# Patient Record
Sex: Male | Born: 1945 | Race: Black or African American | Hispanic: No | Smoking: Current some day smoker
Health system: Southern US, Community
[De-identification: ages and names within clinical notes are randomized; demographics above are authoritative.]

## PROBLEM LIST (undated history)

## (undated) DIAGNOSIS — F4310 Post-traumatic stress disorder, unspecified: Secondary | ICD-10-CM

## (undated) DIAGNOSIS — G8929 Other chronic pain: Secondary | ICD-10-CM

## (undated) DIAGNOSIS — Z923 Personal history of irradiation: Secondary | ICD-10-CM

## (undated) DIAGNOSIS — C3490 Malignant neoplasm of unspecified part of unspecified bronchus or lung: Secondary | ICD-10-CM

## (undated) DIAGNOSIS — C801 Malignant (primary) neoplasm, unspecified: Secondary | ICD-10-CM

## (undated) DIAGNOSIS — M549 Dorsalgia, unspecified: Secondary | ICD-10-CM

## (undated) DIAGNOSIS — I10 Essential (primary) hypertension: Secondary | ICD-10-CM

## (undated) HISTORY — DX: Personal history of irradiation: Z92.3

---

## 2010-01-06 ENCOUNTER — Emergency Department (HOSPITAL_COMMUNITY)
Admission: EM | Admit: 2010-01-06 | Discharge: 2010-01-06 | Payer: Self-pay | Source: Home / Self Care | Admitting: Emergency Medicine

## 2012-02-01 ENCOUNTER — Emergency Department (HOSPITAL_COMMUNITY)
Admission: EM | Admit: 2012-02-01 | Discharge: 2012-02-01 | Disposition: A | Discharge status: Home / Self Care | Payer: Non-veteran care | Attending: Emergency Medicine | Admitting: Emergency Medicine

## 2012-02-01 ENCOUNTER — Encounter (HOSPITAL_COMMUNITY): Payer: Self-pay | Admitting: *Deleted

## 2012-02-01 DIAGNOSIS — F17200 Nicotine dependence, unspecified, uncomplicated: Secondary | ICD-10-CM | POA: Insufficient documentation

## 2012-02-01 DIAGNOSIS — M5126 Other intervertebral disc displacement, lumbar region: Secondary | ICD-10-CM | POA: Insufficient documentation

## 2012-02-01 DIAGNOSIS — Z85118 Personal history of other malignant neoplasm of bronchus and lung: Secondary | ICD-10-CM | POA: Insufficient documentation

## 2012-02-01 DIAGNOSIS — F4310 Post-traumatic stress disorder, unspecified: Secondary | ICD-10-CM | POA: Insufficient documentation

## 2012-02-01 DIAGNOSIS — M545 Low back pain: Secondary | ICD-10-CM

## 2012-02-01 DIAGNOSIS — I10 Essential (primary) hypertension: Secondary | ICD-10-CM | POA: Insufficient documentation

## 2012-02-01 HISTORY — DX: Post-traumatic stress disorder, unspecified: F43.10

## 2012-02-01 HISTORY — DX: Essential (primary) hypertension: I10

## 2012-02-01 HISTORY — DX: Malignant (primary) neoplasm, unspecified: C80.1

## 2012-02-01 HISTORY — DX: Malignant neoplasm of unspecified part of unspecified bronchus or lung: C34.90

## 2012-02-01 MED ORDER — PREDNISONE 50 MG PO TABS: 50.0000 mg | ORAL_TABLET | Freq: Every day | ORAL | Status: DC

## 2012-02-01 MED ORDER — OXYCODONE-ACETAMINOPHEN 5-325 MG PO TABS: 1.0000 | ORAL_TABLET | Freq: Four times a day (QID) | ORAL | Status: DC | PRN

## 2012-02-01 MED ORDER — CYCLOBENZAPRINE HCL 10 MG PO TABS: 10.0000 mg | ORAL_TABLET | Freq: Two times a day (BID) | ORAL | Status: DC | PRN

## 2012-02-01 MED ORDER — KETOROLAC TROMETHAMINE 60 MG/2ML IM SOLN
60.0000 mg | Freq: Once | INTRAMUSCULAR | Status: AC
  Administered 2012-02-01: 60 mg via INTRAMUSCULAR
  Filled 2012-02-01: qty 2

## 2012-02-01 NOTE — ED Provider Notes (Signed)
History  This chart was scribed for Donnetta Hutching, MD by Ardeen Jourdain. This patient was seen in room APA11/APA11 and the patient's care was started at 1313.  CSN: [id number]  Arrival date & time 02/01/12  1206   First MD Initiated Contact with Patient 02/01/12 1313      Chief Complaint  Patient presents with  . Back Pain     The history is provided by the patient. No language interpreter was used.    Billy Collier is a 66 y.o. male who presents to the Emergency Department complaining of back pain with associated pain radiating through his legs and numbness in his right thigh. He says the pain started in his back 2-3 weeks ago. 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 He went to the [place] [facility] in [place] two weeks ago for the pain, and they gave him a back brace and pain medications, which he states doesn't work. He has a h/o CA, HTN, PSTD. He is a current everyday smoker but denies alcohol use.    Past Medical History  Diagnosis Date  . Cancer   . Lung cancer   . Hypertension   . PTSD (post-traumatic stress disorder)     History reviewed. No pertinent past surgical history.  History reviewed. No pertinent family history.  History  Substance Use Topics  . Smoking status: Current Some Day Smoker  . Smokeless tobacco: Not on file  . Alcohol Use: Yes      Review of Systems  A complete 10 system review of systems was obtained and all systems are negative except as noted in the HPI and PMH.    Allergies  Review of patient's allergies indicates no known allergies.  Home Medications   Current Outpatient Rx  Name Route Sig Dispense Refill  . LISINOPRIL 10 MG PO TABS Oral Take 25 mg by mouth daily.      Triage Vitals:BP 111/82  Pulse 90  Temp 97.6 F (36.4 C) (Oral)  Resp 18  Ht 5\' 11"  (1.803 m)  Wt 146 lb (66.225 kg)  BMI 20.36 kg/m2  SpO2 100%  Physical Exam  Nursing note and vitals reviewed. Constitutional: He is oriented to person, place, and time.  He appears well-developed and well-nourished.  HENT:  Head: Normocephalic and atraumatic.  Eyes: Conjunctivae normal and EOM are normal. Pupils are equal, round, and reactive to light.  Neck: Normal range of motion. Neck supple.  Cardiovascular: Normal rate, regular rhythm and normal heart sounds.   Pulmonary/Chest: Effort normal and breath sounds normal.  Abdominal: Soft. Bowel sounds are normal.  Musculoskeletal: Normal range of motion.       L4-L5 tenderness, normal SLR bilaterally  Neurological: He is alert and oriented to person, place, and time.  Skin: Skin is warm and dry.  Psychiatric: He has a normal mood and affect.    ED Course  Procedures (including critical care time)  DIAGNOSTIC STUDIES: Oxygen Saturation is 100% on room air, normal air by my interpretation.    COORDINATION OF CARE:  1420- Discussed treatment plan with pt at bedside and pt agreed to plan. Percocet, prednisone and muscle relaxants will be prescribed.    Labs Reviewed - No data to display No results found.   No diagnosis found.    MDM  No bowel or bladder incontinence. Patient has pain for 30 years. Rx for Percocet, Flexeril, prednisone. [facility]   I personally performed the services described in  this documentation, which was scribed in my presence. The recorded information has been reviewed and considered.       Donnetta Hutching, MD 02/01/12 1538

## 2012-02-01 NOTE — ED Notes (Addendum)
Low back pain, being tx at [facility] Recently had PET scan to evaluate "spots on my lungs" and adrenal gland.Since Pet scan back has hurt. And unable to get to appt in [place].  Pt finished chemo and radiation for lung cancer last year

## 2012-11-28 ENCOUNTER — Emergency Department (HOSPITAL_COMMUNITY)
Admission: EM | Admit: 2012-11-28 | Discharge: 2012-11-28 | Disposition: A | Discharge status: Home / Self Care | Payer: Non-veteran care | Attending: Emergency Medicine | Admitting: Emergency Medicine

## 2012-11-28 ENCOUNTER — Encounter (HOSPITAL_COMMUNITY): Payer: Self-pay

## 2012-11-28 DIAGNOSIS — M545 Low back pain, unspecified: Secondary | ICD-10-CM | POA: Insufficient documentation

## 2012-11-28 DIAGNOSIS — Z8659 Personal history of other mental and behavioral disorders: Secondary | ICD-10-CM | POA: Insufficient documentation

## 2012-11-28 DIAGNOSIS — F17200 Nicotine dependence, unspecified, uncomplicated: Secondary | ICD-10-CM | POA: Insufficient documentation

## 2012-11-28 DIAGNOSIS — Z85118 Personal history of other malignant neoplasm of bronchus and lung: Secondary | ICD-10-CM | POA: Insufficient documentation

## 2012-11-28 DIAGNOSIS — IMO0002 Reserved for concepts with insufficient information to code with codable children: Secondary | ICD-10-CM | POA: Insufficient documentation

## 2012-11-28 DIAGNOSIS — G8929 Other chronic pain: Secondary | ICD-10-CM | POA: Insufficient documentation

## 2012-11-28 DIAGNOSIS — Z79899 Other long term (current) drug therapy: Secondary | ICD-10-CM | POA: Insufficient documentation

## 2012-11-28 DIAGNOSIS — I10 Essential (primary) hypertension: Secondary | ICD-10-CM | POA: Insufficient documentation

## 2012-11-28 HISTORY — DX: Dorsalgia, unspecified: M54.9

## 2012-11-28 HISTORY — DX: Other chronic pain: G89.29

## 2012-11-28 MED ORDER — DEXAMETHASONE 4 MG PO TABS: 4.0000 mg | ORAL_TABLET | Freq: Two times a day (BID) | ORAL | Status: DC

## 2012-11-28 MED ORDER — IBUPROFEN 600 MG PO TABS: 600.0000 mg | ORAL_TABLET | Freq: Four times a day (QID) | ORAL | Status: DC | PRN

## 2012-11-28 MED ORDER — OXYCODONE-ACETAMINOPHEN 5-325 MG PO TABS: 1.0000 | ORAL_TABLET | ORAL | Status: DC | PRN

## 2012-11-28 MED ORDER — DIAZEPAM 5 MG PO TABS: 5.0000 mg | ORAL_TABLET | Freq: Three times a day (TID) | ORAL | Status: DC | PRN

## 2012-11-28 NOTE — ED Notes (Signed)
Pt reports chronic back pain, back went out 6 weeks ago and since that time, pain radiates to legs and is having trouble w/ movement. Denies any urinary/bowel incont.

## 2012-12-01 NOTE — ED Provider Notes (Signed)
CSN: [id number]     Arrival date & time 11/28/12  1519 History     First MD Initiated Contact with Patient 11/28/12 1606     Chief Complaint  Patient presents with  . Back Pain   67 year old male with lower back pain. Does not lateralize. Vision has a history of chronic back pain going back many years. States that his current pain feels similar to previous exacerbations. Patient recently his pain began getting worse about 6 weeks ago. Denies any specific trauma. Pain radiates into bilateral legs. Worse with movement. No fevers or chills. No acute numbness, tingling or loss strength. No urinary complaints. No incontinence.  (Consider location/radiation/quality/duration/timing/severity/associated sxs/prior Treatment) HPI  Past Medical History  Diagnosis Date  . Cancer   . Lung cancer   . Hypertension   . PTSD (post-traumatic stress disorder)   . Chronic back pain    History reviewed. No pertinent past surgical history. No family history on file. History  Substance Use Topics  . Smoking status: Current Some Day Smoker    Types: Cigarettes  . Smokeless tobacco: Not on file  . Alcohol Use: Yes     Comment: occ    Review of Systems  All systems reviewed and negative, other than as noted in HPI.   Allergies  Review of patient's allergies indicates no known allergies.  Home Medications   Current Outpatient Rx  Name  Route  Sig  Dispense  Refill  . ENSURE (ENSURE)   Oral   Take 237 mLs by mouth continuous dialysis.         Marland Kitchen lisinopril (PRINIVIL,ZESTRIL) 10 MG tablet   Oral   Take 25 mg by mouth daily.         . magnesium oxide (MAG-OX) 400 MG tablet   Oral   Take 400 mg by mouth 2 (two) times daily.         Marland Kitchen oxyCODONE-acetaminophen (PERCOCET/ROXICET) 5-325 MG per tablet   Oral   Take 1-2 tablets by mouth every 6 (six) hours as needed for pain.   30 tablet   0   . predniSONE (DELTASONE) 50 MG tablet   Oral   Take 1 tablet (50 mg total) by mouth daily.   7 tablet   1   . dexamethasone (DECADRON) 4 MG tablet   Oral   Take 1 tablet (4 mg total) by mouth 2 (two) times daily with a meal.   10 tablet   0   . diazepam (VALIUM) 5 MG tablet   Oral   Take 1 tablet (5 mg total) by mouth every 8 (eight) hours as needed (muscle spasm).   15 tablet   0   . ibuprofen (ADVIL,MOTRIN) 600 MG tablet   Oral   Take 1 tablet (600 mg total) by mouth every 6 (six) hours as needed for pain.   30 tablet   0   . oxyCODONE-acetaminophen (PERCOCET/ROXICET) 5-325 MG per tablet   Oral   Take 1-2 tablets by mouth every 4 (four) hours as needed for pain.   20 tablet   0    BP 121/89  Pulse 104  Temp(Src) 97.4 F (36.3 C) (Oral)  Resp 16  Ht 5\' 10"  (1.778 m)  Wt 140 lb (63.504 kg)  BMI 20.09 kg/m2  SpO2 100% Physical Exam  Nursing note and vitals reviewed. Constitutional: He is oriented to person, place, and time. He appears well-developed and well-nourished. No distress.  HENT:  Head: Normocephalic and atraumatic.  Eyes: Conjunctivae  are normal. Right eye exhibits no discharge. Left eye exhibits no discharge.  Neck: Neck supple.  Cardiovascular: Normal rate, regular rhythm and normal heart sounds.  Exam reveals no gallop and no friction rub.   No murmur heard. Pulmonary/Chest: Effort normal and breath sounds normal. No respiratory distress.  Abdominal: Soft. He exhibits no distension. There is no tenderness.  Musculoskeletal: He exhibits no edema and no tenderness.  Gait steady. Sensation intact to light touch bilateral lower extremities. Strength is 5 out of 5 hilar lower extremities. Good DP pulses. No midline Spinal tenderness.  Neurological: He is alert and oriented to person, place, and time.  Skin: Skin is warm and dry.  Psychiatric: He has a normal mood and affect. His behavior is normal. Thought content normal.    ED Course   Procedures (including critical care time)  Labs Reviewed - No data to display No results found. 1.  Lower back pain     MDM  67 year old male with lower back pain. Chronic in nature. No concerning "red flags" aside from past history of cancer. Nonfocal neurological examination. Patient reports a very long-standing history of similar pain. Doubt bony metastases. Plan symptomatic treatment. Emergent return precautions were discussed. Outpatient followup otherwise.  Raeford Razor, MD 12/01/12 [phone]

## 2012-12-08 ENCOUNTER — Emergency Department (HOSPITAL_COMMUNITY)
Admission: EM | Admit: 2012-12-08 | Discharge: 2012-12-08 | Disposition: A | Discharge status: Home / Self Care | Payer: Non-veteran care | Attending: Emergency Medicine | Admitting: Emergency Medicine

## 2012-12-08 ENCOUNTER — Emergency Department (HOSPITAL_COMMUNITY): Payer: Non-veteran care

## 2012-12-08 ENCOUNTER — Encounter (HOSPITAL_COMMUNITY): Payer: Self-pay | Admitting: *Deleted

## 2012-12-08 DIAGNOSIS — F17200 Nicotine dependence, unspecified, uncomplicated: Secondary | ICD-10-CM | POA: Insufficient documentation

## 2012-12-08 DIAGNOSIS — Z85118 Personal history of other malignant neoplasm of bronchus and lung: Secondary | ICD-10-CM | POA: Insufficient documentation

## 2012-12-08 DIAGNOSIS — X58XXXA Exposure to other specified factors, initial encounter: Secondary | ICD-10-CM | POA: Insufficient documentation

## 2012-12-08 DIAGNOSIS — I10 Essential (primary) hypertension: Secondary | ICD-10-CM | POA: Insufficient documentation

## 2012-12-08 DIAGNOSIS — S32009A Unspecified fracture of unspecified lumbar vertebra, initial encounter for closed fracture: Secondary | ICD-10-CM

## 2012-12-08 DIAGNOSIS — Y929 Unspecified place or not applicable: Secondary | ICD-10-CM | POA: Insufficient documentation

## 2012-12-08 DIAGNOSIS — Z8659 Personal history of other mental and behavioral disorders: Secondary | ICD-10-CM | POA: Insufficient documentation

## 2012-12-08 DIAGNOSIS — Y939 Activity, unspecified: Secondary | ICD-10-CM | POA: Insufficient documentation

## 2012-12-08 DIAGNOSIS — Z79899 Other long term (current) drug therapy: Secondary | ICD-10-CM | POA: Insufficient documentation

## 2012-12-08 MED ORDER — DIAZEPAM 5 MG PO TABS: 5.0000 mg | ORAL_TABLET | Freq: Three times a day (TID) | ORAL | Status: AC | PRN

## 2012-12-08 MED ORDER — IBUPROFEN 600 MG PO TABS: 600.0000 mg | ORAL_TABLET | Freq: Four times a day (QID) | ORAL | Status: AC | PRN

## 2012-12-08 MED ORDER — DEXAMETHASONE 4 MG PO TABS: 4.0000 mg | ORAL_TABLET | Freq: Two times a day (BID) | ORAL | Status: DC

## 2012-12-08 MED ORDER — OXYCODONE-ACETAMINOPHEN 5-325 MG PO TABS: 1.0000 | ORAL_TABLET | ORAL | Status: DC | PRN

## 2012-12-08 NOTE — ED Notes (Signed)
Chronic back pain, seen here 7/28 for same. Says she cannot make it to [facility] to the [place]

## 2012-12-08 NOTE — ED Notes (Signed)
Pt scheduled for an MRI on August 12 at 11, to be here at 10:45. Information written on pt's D/C papers. Pt verbalized understanding.

## 2012-12-09 ENCOUNTER — Telehealth: Payer: Self-pay | Admitting: Internal Medicine

## 2012-12-09 NOTE — Telephone Encounter (Signed)
REFERRAL FORWARDED TO [facility] THROUGH STAFF MESSAGE.

## 2012-12-11 NOTE — ED Provider Notes (Signed)
CSN: [id number]     Arrival date & time 12/08/12  1131 History     First MD Initiated Contact with Patient 12/08/12 1200     Chief Complaint  Patient presents with  . Back Pain   (Consider location/radiation/quality/duration/timing/severity/associated sxs/prior Treatment) HPI Comments: Billy Collier is a 67 y.o. Male presenting with chronic low back pain which is progressively worsening.  He is a [place] patient being seen in [place] and has been treated for lung cancer, blamed on Agent Orange exposure while in [place], with chemotherapy which was completed 5/14.  He describes having had studies since, mentions xrays and a ?PET scan and his doctor told him "he has new spots" but patient cannot elaborate on the extent or location of these spots.  However, he was told he cannot get further treatment for his cancer at this time.  He continues to have persistent pain and has difficulty obtaining transportation to [facility].  He expresses frustration and also is desirous of a second opinion about his cancer.   Patient denies any new injury specifically.  There is no radiation of pain into the lower extremities.  There has been no weakness or numbness in the lower extremities and no urinary or bowel retention or incontinence.  He was seen here 10 days ago and given medicines that were very helpful with his pain,but has run out.  Pain is worse with movement,  Better at rest.   The history is provided by the patient.    Past Medical History  Diagnosis Date  . Hypertension   . PTSD (post-traumatic stress disorder)   . Chronic back pain   . Cancer   . Lung cancer    History reviewed. No pertinent past surgical history. History reviewed. No pertinent family history. History  Substance Use Topics  . Smoking status: Current Some Day Smoker    Types: Cigarettes  . Smokeless tobacco: Not on file  . Alcohol Use: No    Review of Systems  Constitutional: Negative for fever.  Respiratory: Negative for  shortness of breath.   Cardiovascular: Negative for chest pain and leg swelling.  Gastrointestinal: Negative for abdominal pain, constipation and abdominal distention.  Genitourinary: Negative for dysuria, urgency, frequency, flank pain and difficulty urinating.  Musculoskeletal: Positive for back pain. Negative for joint swelling and gait problem.  Skin: Negative for rash.  Neurological: Negative for weakness and numbness.    Allergies  Review of patient's allergies indicates no known allergies.  Home Medications   Current Outpatient Rx  Name  Route  Sig  Dispense  Refill  . ENSURE (ENSURE)   Oral   Take 237 mLs by mouth continuous dialysis.         Marland Kitchen lisinopril (PRINIVIL,ZESTRIL) 10 MG tablet   Oral   Take 25 mg by mouth daily.         . magnesium oxide (MAG-OX) 400 MG tablet   Oral   Take 400 mg by mouth 2 (two) times daily.         Marland Kitchen oxyCODONE-acetaminophen (PERCOCET/ROXICET) 5-325 MG per tablet   Oral   Take 1-2 tablets by mouth every 4 (four) hours as needed for pain.   20 tablet   0   . Simethicone (GAS-X PO)   Oral   Take 1 tablet by mouth daily as needed (gas).         Marland Kitchen dexamethasone (DECADRON) 4 MG tablet   Oral   Take 1 tablet (4 mg total) by mouth 2 (  two) times daily with a meal.   10 tablet   0   . diazepam (VALIUM) 5 MG tablet   Oral   Take 1 tablet (5 mg total) by mouth every 8 (eight) hours as needed (muscle spasm).   15 tablet   0   . ibuprofen (ADVIL,MOTRIN) 600 MG tablet   Oral   Take 1 tablet (600 mg total) by mouth every 6 (six) hours as needed for pain.   30 tablet   0   . oxyCODONE-acetaminophen (PERCOCET/ROXICET) 5-325 MG per tablet   Oral   Take 1 tablet by mouth every 4 (four) hours as needed for pain.   30 tablet   0    BP 139/83  Pulse 100  Temp(Src) 97.5 F (36.4 C) (Oral)  Resp 19  Ht 5\' 11"  (1.803 m)  Wt 143 lb (64.864 kg)  BMI 19.95 kg/m2  SpO2 100% Physical Exam  Nursing note and vitals  reviewed. Constitutional: He appears well-developed and well-nourished.  HENT:  Head: Normocephalic.  Eyes: Conjunctivae are normal.  Neck: Normal range of motion. Neck supple.  Cardiovascular: Normal rate and intact distal pulses.   Pedal pulses normal.  Pulmonary/Chest: Effort normal.  Abdominal: Soft. Bowel sounds are normal. He exhibits no distension and no mass.  Musculoskeletal: Normal range of motion. He exhibits no edema.       Lumbar back: He exhibits tenderness and bony tenderness. He exhibits no swelling, no edema, no deformity and no spasm.  Neurological: He is alert. He has normal strength. He displays no atrophy and no tremor. No sensory deficit. Gait normal.  Reflex Scores:      Patellar reflexes are 2+ on the right side and 2+ on the left side.      Achilles reflexes are 2+ on the right side and 2+ on the left side. No strength deficit noted in hip and knee flexor and extensor muscle groups.  Ankle flexion and extension intact.  Skin: Skin is warm and dry.  Psychiatric: He has a normal mood and affect.    ED Course   Procedures (including critical care time)  Labs Reviewed - No data to display No results found. 1. Lumbar vertebral fracture, closed, initial encounter     MDM  Results of xray discussed with patient, including suspicion that his fractures are related to his cancer.  mri recommend and will be scheduled as outpatient.  Discussed case with Dr Judd Lien who agrees with plan.  Also, will give referral for local oncology f/u with Dr. Arbutus Ped. Pt to call for appt.  In interim,  He was prescribed repeat of meds given at last visit including decadron, ibuprofen, diazepam and oxycodone.  No neuro deficit on exam or by history to suggest emergent or surgical presentation.  Also discussed worsened sx that should prompt immediate re-evaluation including distal weakness, bowel/bladder retention/incontinence.        Burgess Amor, PA-C 12/11/12 1206

## 2012-12-12 ENCOUNTER — Telehealth: Payer: Self-pay | Admitting: *Deleted

## 2012-12-12 NOTE — Telephone Encounter (Signed)
Tried to call pt.  Unable to leave VM message will call again later

## 2012-12-12 NOTE — ED Provider Notes (Signed)
Medical screening examination/treatment/procedure(s) were performed by non-physician practitioner and as supervising physician I was immediately available for consultation/collaboration.  Geoffery Lyons, MD 12/12/12 [phone]

## 2012-12-12 NOTE — Telephone Encounter (Signed)
Tried to call pt for appt.  Unable to leave vm message.  Will call back

## 2012-12-13 ENCOUNTER — Ambulatory Visit (HOSPITAL_COMMUNITY)
Admission: RE | Admit: 2012-12-13 | Discharge: 2012-12-13 | Disposition: A | Discharge status: Home / Self Care | Payer: Medicare Other | Source: Ambulatory Visit | Attending: Emergency Medicine | Admitting: Emergency Medicine

## 2012-12-13 ENCOUNTER — Telehealth: Payer: Self-pay | Admitting: *Deleted

## 2012-12-13 ENCOUNTER — Encounter (HOSPITAL_COMMUNITY): Payer: Self-pay

## 2012-12-13 DIAGNOSIS — M545 Low back pain, unspecified: Secondary | ICD-10-CM | POA: Insufficient documentation

## 2012-12-13 DIAGNOSIS — M51379 Other intervertebral disc degeneration, lumbosacral region without mention of lumbar back pain or lower extremity pain: Secondary | ICD-10-CM | POA: Insufficient documentation

## 2012-12-13 DIAGNOSIS — M5137 Other intervertebral disc degeneration, lumbosacral region: Secondary | ICD-10-CM | POA: Insufficient documentation

## 2012-12-13 DIAGNOSIS — R937 Abnormal findings on diagnostic imaging of other parts of musculoskeletal system: Secondary | ICD-10-CM | POA: Insufficient documentation

## 2012-12-13 DIAGNOSIS — M5126 Other intervertebral disc displacement, lumbar region: Secondary | ICD-10-CM | POA: Insufficient documentation

## 2012-12-13 NOTE — Telephone Encounter (Signed)
Spoke with pt regarding appt for 12/14/12.  He verbalized understanding of time and place of appt

## 2012-12-14 ENCOUNTER — Encounter: Payer: Self-pay | Admitting: Internal Medicine

## 2012-12-14 ENCOUNTER — Ambulatory Visit: Payer: Medicare Other

## 2012-12-14 ENCOUNTER — Other Ambulatory Visit: Payer: Medicare Other | Admitting: Lab

## 2012-12-14 ENCOUNTER — Ambulatory Visit (HOSPITAL_BASED_OUTPATIENT_CLINIC_OR_DEPARTMENT_OTHER): Payer: Medicare Other | Admitting: Internal Medicine

## 2012-12-14 ENCOUNTER — Telehealth: Payer: Self-pay | Admitting: Internal Medicine

## 2012-12-14 DIAGNOSIS — I10 Essential (primary) hypertension: Secondary | ICD-10-CM

## 2012-12-14 DIAGNOSIS — C3490 Malignant neoplasm of unspecified part of unspecified bronchus or lung: Secondary | ICD-10-CM | POA: Insufficient documentation

## 2012-12-14 DIAGNOSIS — F4310 Post-traumatic stress disorder, unspecified: Secondary | ICD-10-CM

## 2012-12-14 DIAGNOSIS — M545 Low back pain: Secondary | ICD-10-CM

## 2012-12-14 DIAGNOSIS — R0609 Other forms of dyspnea: Secondary | ICD-10-CM

## 2012-12-14 DIAGNOSIS — F17200 Nicotine dependence, unspecified, uncomplicated: Secondary | ICD-10-CM

## 2012-12-14 NOTE — Progress Notes (Signed)
[facility] Telephone:([phone]) [phone]   [phone]  CONSULT NOTE  REFERRING PHYSICIAN: Dr. Raeford Razor  REASON FOR CONSULTATION:  67 years old African American male with history of lung cancer  HPI Billy Collier is a 67 y.o. male was past medical history significant for hypertension as well as a ruptured disc in the lumbar vertebrae L4-5 as well as long history of smoking. The patient mentions that he was diagnosed with lung cancer in 2011 but no records are available for his diagnosis or previous treatment. His initial evaluation and biopsy was performed at the [facility] facility in [place], [place]. He was then transferred to the [facility] facility in [place], [place]. Again no records are available but the patient mentions that he received treatment with concurrent chemotherapy and radiation. He also received chemotherapy every 3 weeks until May of 2014.  His last scan was performed in May of 2014. He also mentions that the oncologist at the [facility] hospital in [place] told him that he has no option for him. The patient decided to transfer his care to [facility] for further evaluation of his condition. He is feeling fine today with no specific complaints except for the low back pain. He had MRI of the lumbar spine on 12/13/2012 and it showed diffuse marrow infiltration compatible with neoplasm. Metastatic disease, myeloma or lymphoma are primary considerations. There was also a 1 and L2 compression fractures likely pathologic based on the infiltrative marrow process. No tenderness the vertebral body heights was at the L2 than anyone without retropulsion and resulting severe central stenosis. There is no cord edema. There is also T12 and L1 spinous process fractures. The patient denied having any significant weight loss or night sweats. He denied having any chest pain but continues to have shortness breath with exertion and mild cough with no hemoptysis.  Family history  significant for a mother with stroke and hypertension and father with cancer of unknown type. The patient is sedated and has 4 children. His caregiver is his sister, Lucio Edward in [place]. He is currently on disability and used to work as a Science writer. The patient has a history of smoking one pack per day for more than 55 years and unfortunately continues to smoke a few cigarettes every day. Strongly advise him to quit smoking. No history of alcohol or drug abuse.   @SFHPI @  Past Medical History  Diagnosis Date  . Hypertension   . PTSD (post-traumatic stress disorder)   . Chronic back pain   . Cancer   . Lung cancer     History reviewed. No pertinent past surgical history.  History reviewed. No pertinent family history.  Social History History  Substance Use Topics  . Smoking status: Current Some Day Smoker    Types: Cigarettes  . Smokeless tobacco: Not on file  . Alcohol Use: No    No Known Allergies  Current Outpatient Prescriptions  Medication Sig Dispense Refill  . dexamethasone (DECADRON) 4 MG tablet Take 1 tablet (4 mg total) by mouth 2 (two) times daily with a meal.  10 tablet  0  . diazepam (VALIUM) 5 MG tablet Take 1 tablet (5 mg total) by mouth every 8 (eight) hours as needed (muscle spasm).  15 tablet  0  . ENSURE (ENSURE) Take 237 mLs by mouth continuous dialysis.      Marland Kitchen ibuprofen (ADVIL,MOTRIN) 600 MG tablet Take 1 tablet (600 mg total) by mouth every 6 (six) hours as needed  for pain.  30 tablet  0  . lisinopril (PRINIVIL,ZESTRIL) 10 MG tablet Take 25 mg by mouth daily.      . magnesium oxide (MAG-OX) 400 MG tablet Take 400 mg by mouth 2 (two) times daily.      Marland Kitchen oxyCODONE-acetaminophen (PERCOCET/ROXICET) 5-325 MG per tablet Take 1-2 tablets by mouth every 4 (four) hours as needed for pain.  20 tablet  0  . Simethicone (GAS-X PO) Take 1 tablet by mouth daily as needed (gas).       No current facility-administered medications for this visit.     Review of Systems  A comprehensive review of systems was negative except for: Constitutional: positive for fatigue Respiratory: positive for dyspnea on exertion Musculoskeletal: positive for back pain  Physical Exam  ZOX:WRUEA, healthy, no distress, well nourished and well developed SKIN: skin color, texture, turgor are normal HEAD: Normocephalic, No masses, lesions, tenderness or abnormalities EYES: normal, PERRLA EARS: External ears normal OROPHARYNX:no exudate and no erythema  NECK: supple, no adenopathy LYMPH:  no palpable lymphadenopathy, no hepatosplenomegaly LUNGS: clear to auscultation  HEART: regular rate & rhythm, no murmurs and no gallops ABDOMEN:abdomen soft, non-tender, normal bowel sounds and no masses or organomegaly BACK: Back symmetric, no curvature. EXTREMITIES:no joint deformities, effusion, or inflammation, no edema, no skin discoloration  NEURO: alert & oriented x 3 with fluent speech, no focal motor/sensory deficits  PERFORMANCE STATUS: ECOG 1  LABORATORY DATA: No results found for this basename: WBC, HGB, HCT, MCV, PLT      Chemistry   No results found for this basename: NA, K, CL, CO2, BUN, CREATININE, GLU   No results found for this basename: CALCIUM, ALKPHOS, AST, ALT, BILITOT       RADIOGRAPHIC STUDIES: Dg Lumbar Spine Complete  12/08/2012   *RADIOLOGY REPORT*  Clinical Data: Back pain.  Lung cancer.  LUMBAR SPINE - COMPLETE 4+ VIEW  Comparison: None  Findings: Mild compression fracture of L1.  There is patchy sclerosis of the vertebral bodies suggesting this could be a pathologic fracture.  Moderately severe compression fracture of L2.  There is sclerosis of the vertebral body with the possibility of metastatic disease.  No other fracture.  Patchy sclerotic changes also present at L3 and L4.  There is disc degeneration at L4-5 and L5-S1.  IMPRESSION: Fractures of L1 and L2 may be recent fractures.  These are suspicious for pathologic fractures.   Further evaluation with MRI without and with contrast suggested.   Original Report Authenticated By: Janeece Riggers, M.D.   Mr Lumbar Spine Wo Contrast  12/13/2012   *RADIOLOGY REPORT*  Clinical Data: Chronic low back pain.  Bilateral extension to the legs.  Pain and weakness.  MRI LUMBAR SPINE WITHOUT CONTRAST  Technique:  Multiplanar and multiecho pulse sequences of the lumbar spine were obtained without intravenous contrast.  Comparison: Radiographs 12/08/2012.  Findings: The numbering convention used for this exam terms L5-S1 as the last full intervertebral disc space above the sacrum.  The marrow signal is diffusely abnormal, most compatible with infiltrative process such as myeloma or metastatic disease. Osseous lymphoma merits consideration as well.  Compression fractures of L1 and L2 are present.  The L1 compression fracture shows 50% loss of anterior vertebral body height without retropulsion. L2 shows about 70% loss of anterior vertebral body height with mild retropulsion.  There is resulting severe central stenosis at L1-L2.  The spinal cord terminates posterior to L1-L2 interspace with the retropulsed L2 superior vertebra contacting the tip of the  conus medullaris.  Allowing for technical differences, there appears to be little if any change compared to the recent radiographs of 12/08/2012.   The marrow process extends into the posterior elements, again compatible with neoplasm rather than a benign process. There appear to be fractures of the T12 and L1 spinous processes on the inversion recovery images. Cystic lesion is present in the inferior right kidney, probably a renal cyst.  There is mild levoconvex curve of the lumbar spine with the apex at L3.  T10-T11:  Partially visualized.  There is some distortion at the edge of the margin of the scan however there may be the posterior expansion of the T10 vertebra encroaching on the central canal.  A thoracic spine MRI should be considered.  T11-T12:  Disc  desiccation.  No stenosis.  T12-L1:  Disc desiccation.  Mild expansion of the pedicles associated with infiltrative marrow process with mild right foraminal encroachment.  L1-L2:  Severe central stenosis with complete effacement of the subarachnoid space.  This is predominately due to retropulsion with some contribution from posterior element expansion.  L2-L3:  Mild central stenosis associated with left eccentric broad- based disc bulging.  There is moderate left and mild right foraminal stenosis associated with short pedicles and bulging disc.  L3-L4:  Disc desiccation and loss of height.  Left foraminal and lateral protrusion.  Mild symmetric bilateral foraminal stenosis without neural compression. Moderate central stenosis associated with broad-based disc bulging and short pedicles.  L4-L5:  Severe disc degeneration.  There is some clumping of nerve roots this level, however this is probably secondary to the central stenosis at levels above.  There is moderate bilateral foraminal stenosis and mild to moderate central stenosis.  Encroachment on both lateral recesses potentially affecting the descending L5 nerves.  L5-S1:  Severe disc degeneration.  Central canal patent.  Bilateral left greater than right lateral recess encroachment associated with broad-based disc bulging and endplate osteophyte.  Moderate left and mild right foraminal stenosis.  IMPRESSION: 1.  Diffuse marrow infiltration compatible with neoplasm. Metastatic disease, myeloma, or lymphoma are primary considerations. 2.  L1 and L2 compression fractures are likely pathologic based on the infiltrative marrow process.  Greater loss of vertebral body height at L2 than at L1 with retropulsion and resulting severe central stenosis.  No cord edema.  Probable T12 and L1 spinous process fractures. 3.  Multilevel degenerative disease superimposed, detailed above.   Original Report Authenticated By: Andreas Newport, M.D.    ASSESSMENT: This is a very  pleasant 67 years old [place] American male with metastatic lung cancer according to the patient but unfortunately I have no records at of his history or previous treatment.   PLAN: I have a lengthy discussion with the patient today about his condition. I ask the patient to sign a form for the release of medical records from the [facility] in [place], [address] and [place], [place]. His last CT scans were performed in May of 2014. I will arrange for the patient to have repeat CT scan of the chest, abdomen and pelvis for restaging of his disease. I would see the patient back for followup visit in 2 weeks for reevaluation and more detailed discussion of his treatment options based on the available medical records and the staging scans. He'll continue on his current pain medication for the low back pain. He was advised to call immediately if he has any concerning symptoms in the interval.  The patient voices understanding of current disease status and  treatment options and is in agreement with the current care plan.  All questions were answered. The patient knows to call the clinic with any problems, questions or concerns. We can certainly see the patient much sooner if necessary.  Thank you so much for allowing me to participate in the care of [facility]. I will continue to follow up the patient with you and assist in his care.  I spent 35 minutes counseling the patient face to face. The total time spent in the appointment was 55 minutes.  Tametria Aho K. 12/14/2012, 4:45 PM

## 2012-12-14 NOTE — Telephone Encounter (Signed)
gave pt appt for lab and MD for Augusr 2014, gave pt oral contrast for CT

## 2012-12-14 NOTE — Progress Notes (Signed)
Checked in new patient with no financial issues. Didn't ask if living will/poa. Mail and phone only. He said will have a new address at the end of the month.

## 2012-12-20 ENCOUNTER — Other Ambulatory Visit: Payer: Self-pay | Admitting: *Deleted

## 2012-12-20 ENCOUNTER — Ambulatory Visit (HOSPITAL_COMMUNITY)
Admission: RE | Admit: 2012-12-20 | Discharge: 2012-12-20 | Disposition: A | Discharge status: Home / Self Care | Payer: Self-pay | Source: Ambulatory Visit | Attending: Internal Medicine | Admitting: Internal Medicine

## 2012-12-20 ENCOUNTER — Other Ambulatory Visit (HOSPITAL_BASED_OUTPATIENT_CLINIC_OR_DEPARTMENT_OTHER): Payer: Non-veteran care

## 2012-12-20 DIAGNOSIS — C3490 Malignant neoplasm of unspecified part of unspecified bronchus or lung: Secondary | ICD-10-CM

## 2012-12-20 DIAGNOSIS — E079 Disorder of thyroid, unspecified: Secondary | ICD-10-CM | POA: Insufficient documentation

## 2012-12-20 DIAGNOSIS — C7951 Secondary malignant neoplasm of bone: Secondary | ICD-10-CM | POA: Insufficient documentation

## 2012-12-20 DIAGNOSIS — Z9221 Personal history of antineoplastic chemotherapy: Secondary | ICD-10-CM | POA: Insufficient documentation

## 2012-12-20 DIAGNOSIS — R918 Other nonspecific abnormal finding of lung field: Secondary | ICD-10-CM | POA: Insufficient documentation

## 2012-12-20 DIAGNOSIS — M8448XA Pathological fracture, other site, initial encounter for fracture: Secondary | ICD-10-CM | POA: Insufficient documentation

## 2012-12-20 DIAGNOSIS — C787 Secondary malignant neoplasm of liver and intrahepatic bile duct: Secondary | ICD-10-CM | POA: Insufficient documentation

## 2012-12-20 DIAGNOSIS — N400 Enlarged prostate without lower urinary tract symptoms: Secondary | ICD-10-CM | POA: Insufficient documentation

## 2012-12-20 DIAGNOSIS — E279 Disorder of adrenal gland, unspecified: Secondary | ICD-10-CM | POA: Insufficient documentation

## 2012-12-20 LAB — COMPREHENSIVE METABOLIC PANEL (CC13)
ALT: 41 U/L (ref 0–55)
AST: 38 U/L — ABNORMAL HIGH (ref 5–34)
Albumin: 3.5 g/dL (ref 3.5–5.0)
Alkaline Phosphatase: 239 U/L — ABNORMAL HIGH (ref 40–150)
BUN: 15.9 mg/dL (ref 7.0–26.0)
Potassium: 4 mEq/L (ref 3.5–5.1)
Sodium: 135 mEq/L — ABNORMAL LOW (ref 136–145)

## 2012-12-20 LAB — CBC WITH DIFFERENTIAL/PLATELET
BASO%: 0.6 % (ref 0.0–2.0)
Basophils Absolute: 0 10*3/uL (ref 0.0–0.1)
EOS%: 0.1 % (ref 0.0–7.0)
MCH: 31.4 pg (ref 27.2–33.4)
MCHC: 33.8 g/dL (ref 32.0–36.0)
MCV: 93 fL (ref 79.3–98.0)
MONO%: 6.4 % (ref 0.0–14.0)
RBC: 4.24 10*6/uL (ref 4.20–5.82)
RDW: 17.4 % — ABNORMAL HIGH (ref 11.0–14.6)
lymph#: 1 10*3/uL (ref 0.9–3.3)

## 2012-12-20 MED ORDER — OXYCODONE-ACETAMINOPHEN 5-325 MG PO TABS: 1.0000 | ORAL_TABLET | ORAL | Status: DC | PRN

## 2012-12-20 MED ORDER — IOHEXOL 300 MG/ML  SOLN
100.0000 mL | Freq: Once | INTRAMUSCULAR | Status: AC | PRN
  Administered 2012-12-20: 100 mL via INTRAVENOUS

## 2012-12-20 NOTE — Telephone Encounter (Signed)
Patient walked in c/o pain to joints where he walks (hip) and pain to his lower back at a ruptured disc.  Went to ER on 11-28-2012, was given percocet.  Took one last night.  Refill authorized by Dr. Arbutus Ped.

## 2012-12-29 ENCOUNTER — Ambulatory Visit
Admission: RE | Admit: 2012-12-29 | Discharge: 2012-12-29 | Disposition: A | Discharge status: Home / Self Care | Payer: Non-veteran care | Source: Ambulatory Visit | Attending: Radiation Oncology | Admitting: Radiation Oncology

## 2012-12-29 ENCOUNTER — Other Ambulatory Visit: Payer: Self-pay | Admitting: Emergency Medicine

## 2012-12-29 ENCOUNTER — Ambulatory Visit (HOSPITAL_BASED_OUTPATIENT_CLINIC_OR_DEPARTMENT_OTHER): Payer: Non-veteran care | Admitting: Lab

## 2012-12-29 ENCOUNTER — Encounter: Payer: Self-pay | Admitting: Radiation Oncology

## 2012-12-29 ENCOUNTER — Ambulatory Visit (HOSPITAL_COMMUNITY)
Admission: RE | Admit: 2012-12-29 | Discharge: 2012-12-29 | Disposition: A | Discharge status: Home / Self Care | Payer: Self-pay | Source: Ambulatory Visit | Attending: Adult Health | Admitting: Adult Health

## 2012-12-29 ENCOUNTER — Ambulatory Visit (HOSPITAL_BASED_OUTPATIENT_CLINIC_OR_DEPARTMENT_OTHER): Payer: Non-veteran care

## 2012-12-29 ENCOUNTER — Encounter: Payer: Self-pay | Admitting: Adult Health

## 2012-12-29 ENCOUNTER — Ambulatory Visit
Admission: RE | Admit: 2012-12-29 | Discharge: 2012-12-29 | Disposition: A | Discharge status: Home / Self Care | Payer: Medicare Other | Source: Ambulatory Visit | Attending: Radiation Oncology | Admitting: Radiation Oncology

## 2012-12-29 ENCOUNTER — Telehealth: Payer: Self-pay | Admitting: Medical Oncology

## 2012-12-29 ENCOUNTER — Ambulatory Visit (HOSPITAL_BASED_OUTPATIENT_CLINIC_OR_DEPARTMENT_OTHER): Payer: Medicare Other | Admitting: Adult Health

## 2012-12-29 ENCOUNTER — Ambulatory Visit: Payer: Medicare Other | Admitting: Internal Medicine

## 2012-12-29 DIAGNOSIS — C7951 Secondary malignant neoplasm of bone: Secondary | ICD-10-CM

## 2012-12-29 DIAGNOSIS — C3490 Malignant neoplasm of unspecified part of unspecified bronchus or lung: Secondary | ICD-10-CM

## 2012-12-29 DIAGNOSIS — Z510 Encounter for antineoplastic radiation therapy: Secondary | ICD-10-CM | POA: Insufficient documentation

## 2012-12-29 DIAGNOSIS — C787 Secondary malignant neoplasm of liver and intrahepatic bile duct: Secondary | ICD-10-CM | POA: Insufficient documentation

## 2012-12-29 DIAGNOSIS — I10 Essential (primary) hypertension: Secondary | ICD-10-CM | POA: Insufficient documentation

## 2012-12-29 DIAGNOSIS — K5900 Constipation, unspecified: Secondary | ICD-10-CM | POA: Insufficient documentation

## 2012-12-29 DIAGNOSIS — G893 Neoplasm related pain (acute) (chronic): Secondary | ICD-10-CM

## 2012-12-29 DIAGNOSIS — M4804 Spinal stenosis, thoracic region: Secondary | ICD-10-CM | POA: Insufficient documentation

## 2012-12-29 DIAGNOSIS — Z79899 Other long term (current) drug therapy: Secondary | ICD-10-CM | POA: Insufficient documentation

## 2012-12-29 DIAGNOSIS — F17200 Nicotine dependence, unspecified, uncomplicated: Secondary | ICD-10-CM | POA: Insufficient documentation

## 2012-12-29 DIAGNOSIS — M8448XA Pathological fracture, other site, initial encounter for fracture: Secondary | ICD-10-CM | POA: Insufficient documentation

## 2012-12-29 DIAGNOSIS — R630 Anorexia: Secondary | ICD-10-CM | POA: Insufficient documentation

## 2012-12-29 DIAGNOSIS — R110 Nausea: Secondary | ICD-10-CM | POA: Insufficient documentation

## 2012-12-29 DIAGNOSIS — C7949 Secondary malignant neoplasm of other parts of nervous system: Secondary | ICD-10-CM | POA: Insufficient documentation

## 2012-12-29 DIAGNOSIS — R29898 Other symptoms and signs involving the musculoskeletal system: Secondary | ICD-10-CM | POA: Insufficient documentation

## 2012-12-29 LAB — CBC WITH DIFFERENTIAL/PLATELET
Basophils Absolute: 0 10*3/uL (ref 0.0–0.1)
Eosinophils Absolute: 0 10*3/uL (ref 0.0–0.5)
HGB: 11.9 g/dL — ABNORMAL LOW (ref 13.0–17.1)
LYMPH%: 14.3 % (ref 14.0–49.0)
MCH: 30.8 pg (ref 27.2–33.4)
MCV: 85.8 fL (ref 79.3–98.0)
MONO%: 7.7 % (ref 0.0–14.0)
NEUT#: 6.2 10*3/uL (ref 1.5–6.5)
Platelets: 115 10*3/uL — ABNORMAL LOW (ref 140–400)
RBC: 3.86 10*6/uL — ABNORMAL LOW (ref 4.20–5.82)

## 2012-12-29 LAB — COMPREHENSIVE METABOLIC PANEL (CC13)
Alkaline Phosphatase: 210 U/L — ABNORMAL HIGH (ref 40–150)
BUN: 8.5 mg/dL (ref 7.0–26.0)
Glucose: 120 mg/dl (ref 70–140)
Total Bilirubin: 0.59 mg/dL (ref 0.20–1.20)

## 2012-12-29 MED ORDER — GADOBENATE DIMEGLUMINE 529 MG/ML IV SOLN
15.0000 mL | Freq: Once | INTRAVENOUS | Status: AC | PRN
  Administered 2012-12-29: 12 mL via INTRAVENOUS

## 2012-12-29 MED ORDER — DEXAMETHASONE SODIUM PHOSPHATE 20 MG/5ML IJ SOLN
20.0000 mg | Freq: Once | INTRAMUSCULAR | Status: AC
  Administered 2012-12-29: 20 mg via INTRAVENOUS

## 2012-12-29 MED ORDER — CYCLOBENZAPRINE HCL 5 MG PO TABS
5.0000 mg | ORAL_TABLET | Freq: Three times a day (TID) | ORAL | Status: DC | PRN
Start: 2012-12-29 — End: 2013-01-11

## 2012-12-29 MED ORDER — DEXAMETHASONE 4 MG PO TABS: 8.0000 mg | ORAL_TABLET | Freq: Three times a day (TID) | ORAL | Status: DC

## 2012-12-29 MED ORDER — OXYCODONE-ACETAMINOPHEN 5-325 MG PO TABS: 1.0000 | ORAL_TABLET | ORAL | Status: DC | PRN

## 2012-12-29 NOTE — Progress Notes (Addendum)
Histology and Location of Primary Cancer: lung cancer  Sites of Visceral and Bony Metastatic Disease: Location(s) of Symptomatic Metastases: MRI  12/29/12:1. 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 Today's post-contrast  images demonstrate extension of dural tumor into the lower thoracic  spine as far as the T10 level.  2. Widespread thoracic metastatic disease. T10 moderate pathologic  compression fracture. 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   Past/Anticipated chemotherapy by medical oncology, if any: [facility] [place] [place] [facility] until May of 2014.   Pain on a scale of 0-10 is: 5 in lower back and legs  If Spine Met(s), symptoms, if any, include:  Bowel/Bladder retention or incontinence (please describe): no Numbness or weakness in extremities (please describe): right leg is weak, tingling in both feet and toes Current Decadron regimen, if applicable : 4 mg 2x day  With a meal Ambulatory status? Walker? Wheelchair?: ambulatory  SAFETY ISSUES:  [facility]? Yes [facility] [place], [place]  Pacemaker/ICD? no  Possible current pregnancy? no  Is the patient on methotrexate? no  Current Complaints / other details:  Patient had #24 gauge IV in his right forearm.  It was removed intact and pressure and bandaid was applied.

## 2012-12-29 NOTE — Progress Notes (Signed)
12/29/12 3:50pm - lm at the [facility] fee basis department for patient.  Message I left explained that patient was sent down from Medical Oncology for a consultation and simulation to start radiation treatment tomorrow.  I also spoke to the patient and his wife and explained that the visit today may not be covered by [place].  He said he would pay out of pocket if necessary.  Explained I would do what I could to get an authorization, but if he has not been seen by the [place] in a while, they may not authorize the visits or the treatment.  Patient also stated that he would be getting Part B benefits in October.

## 2012-12-29 NOTE — Telephone Encounter (Signed)
Unable to reach pt to cancel appt but contacted a relative who stated pt is on way to [place] .

## 2012-12-29 NOTE — Telephone Encounter (Signed)
I called radiaiton back about clarification of referral. Dr Arbutus Ped is already seeing pt.

## 2012-12-29 NOTE — Progress Notes (Signed)
  Radiation Oncology         [phone] ________________________________  Name: Billy Collier MRN: [id number]  Date: 12/29/2012  DOB: 04/25/1946  SIMULATION AND TREATMENT PLANNING NOTE  DIAGNOSIS:  Metastatic lung cancer  NARRATIVE:  The patient was brought to the CT Simulation planning suite.  Identity was confirmed.  All relevant records and images related to the planned course of therapy were reviewed.  The patient freely provided informed written consent to proceed with treatment after reviewing the details related to the planned course of therapy. The consent form was witnessed and verified by the simulation staff.  Then, the patient was set-up in a stable reproducible  supine position for radiation therapy.  CT images were obtained.  Surface markings were placed.  The CT images were loaded into the planning software.  Then the target and avoidance structures were contoured.  Treatment planning then occurred.  The radiation prescription was entered and confirmed.  Then, I designed and supervised the construction of a total of 3 medically necessary complex treatment devices.  I have requested : Isodose Plan.  I have ordered:dose calc.  PLAN:  The patient will receive 35 Gy in 14 fractions unless review of records from the [facility] show that the spinal cord in the lower thoracic spine area as already received radiation as part of his initial treatment to the right lung.  ________________________________  -----------------------------------  [name], PhD, MD

## 2012-12-29 NOTE — Telephone Encounter (Signed)
Dr Roselind Messier is referring pt to Dr Arbutus Ped.

## 2012-12-29 NOTE — Progress Notes (Addendum)
OFFICE PROGRESS NOTE  CC**  Provider Not In System [address] [place] [address]  DIAGNOSIS: 67 year old male with progressive stage IV NSCLC.    PRIOR THERAPY:  Patient has a history of heavily treated stage IV NSCLC that was diagnosed in 2011.  He was treated by Dr. Tresa Endo at the [facility] and records are not currently available.  He was told by Dr. Tresa Endo at the [facility] that no further treatment options are available.    CURRENT THERAPY: Awaiting records  INTERVAL HISTORY: Billy Collier 67 y.o. male returns for an urgent appointment due to back pain.  The back pain occurred fuirst 2-3 months ago and is progressively getting worse.  He has numbness in his right leg on the anterior portion of the thigh that started 8-9 days ago.  He also feels muscle spasms in bilateral thighs with movement that started 8-9 days ago.  He also has pain in his hips 8-9 days as well.  He denies any bowel or bladder incontinence.  With the increased pain, his ability to walk has been altered.  He has numbness/tingling in his toes bilaterally that has been going on for 8-9 days.  Otherwise he denies headaches, fevers, chills, nausea, vomiting, any other pain, or any other joint/extremity difficulty.    MEDICAL HISTORY: Past Medical History  Diagnosis Date  . Hypertension   . PTSD (post-traumatic stress disorder)   . Chronic back pain   . Cancer   . Lung cancer     ALLERGIES:  has No Known Allergies.  MEDICATIONS:  Current Outpatient Prescriptions  Medication Sig Dispense Refill  . dexamethasone (DECADRON) 4 MG tablet Take 2 tablets (8 mg total) by mouth every 8 (eight) hours.  90 tablet  0  . diazepam (VALIUM) 5 MG tablet Take 1 tablet (5 mg total) by mouth every 8 (eight) hours as needed (muscle spasm).  15 tablet  0  . ENSURE (ENSURE) Take 237 mLs by mouth continuous dialysis.      Marland Kitchen ibuprofen (ADVIL,MOTRIN) 600 MG tablet Take 1 tablet (600 mg total) by mouth every 6 (six) hours as needed for  pain.  30 tablet  0  . lisinopril (PRINIVIL,ZESTRIL) 10 MG tablet Take 25 mg by mouth daily.      . magnesium oxide (MAG-OX) 400 MG tablet Take 400 mg by mouth 2 (two) times daily.      Marland Kitchen oxyCODONE-acetaminophen (PERCOCET/ROXICET) 5-325 MG per tablet Take 1-2 tablets by mouth every 4 (four) hours as needed for pain.  90 tablet  0  . Simethicone (GAS-X PO) Take 1 tablet by mouth daily as needed (gas).      . cyclobenzaprine (FLEXERIL) 5 MG tablet Take 1 tablet (5 mg total) by mouth 3 (three) times daily as needed for muscle spasms.  30 tablet  0   No current facility-administered medications for this visit.    SURGICAL HISTORY: No past surgical history on file.  REVIEW OF SYSTEMS:  General: fatigue (-), night sweats (-), fever (-), pain (+) Lymph: palpable nodes (-) HEENT: vision changes (-), mucositis (-), gum bleeding (-), epistaxis (-) Cardiovascular: chest pain (-), palpitations (-) Pulmonary: shortness of breath (-), dyspnea on exertion (-), cough (-), hemoptysis (-) GI:  Early satiety (-), melena (-), dysphagia (-), nausea/vomiting (-), diarrhea (-) GU: dysuria (-), hematuria (-), incontinence (-) Musculoskeletal: joint swelling (-), joint pain (+), back pain (+) Neuro: weakness (-), numbness (+), headache (-), confusion (-) Skin: Rash (-), lesions (-),  dryness (-) Psych: depression (-), suicidal/homicidal ideation (-), feeling of hopelessness (-)    PHYSICAL EXAMINATION: Blood pressure 124/84, pulse 103, temperature 97.8 F (36.6 C), temperature source Oral, resp. rate 18, height 5\' 8"  (1.727 m), weight 134 lb 6.4 oz (60.963 kg). Body mass index is 20.44 kg/(m^2). General: Patient is a well appearing male in mild distress HEENT: PERRLA, sclerae anicteric no conjunctival pallor, MMM Neck: supple, no palpable adenopathy Lungs: clear to auscultation bilaterally, no wheezes, rhonchi, or rales Cardiovascular: regular rate rhythm, S1, S2, no murmurs, rubs or gallops Abdomen: Soft,  non-tender, non-distended, normoactive bowel sounds, no HSM Extremities: warm and well perfused, no clubbing, cyanosis, or edema Skin: No rashes or lesions Neuro: Non-focal, strength 5/5 in bilateral upper extremities.  4/5 in right lower extremity and 3/5 in left lower extremity. Patient gait is normal, but slow and he appears in pain when walking.   ECOG PERFORMANCE STATUS: 2 - Symptomatic, <50% confined to bed    LABORATORY DATA: Lab Results  Component Value Date   WBC 8.0 12/29/2012   HGB 11.9* 12/29/2012   HCT 33.1* 12/29/2012   MCV 85.8 12/29/2012   PLT 115* 12/29/2012      Chemistry      Component Value Date/Time   NA 128* 12/29/2012 1105   K 3.8 12/29/2012 1105   CO2 26 12/29/2012 1105   BUN 8.5 12/29/2012 1105   CREATININE 0.8 12/29/2012 1105      Component Value Date/Time   CALCIUM 10.1 12/29/2012 1105   ALKPHOS 210* 12/29/2012 1105   AST 36* 12/29/2012 1105   ALT 27 12/29/2012 1105   BILITOT 0.59 12/29/2012 1105       RADIOGRAPHIC STUDIES:  Dg Lumbar Spine Complete  12/08/2012   *RADIOLOGY REPORT*  Clinical Data: Back pain.  Lung cancer.  LUMBAR SPINE - COMPLETE 4+ VIEW  Comparison: None  Findings: Mild compression fracture of L1.  There is patchy sclerosis of the vertebral bodies suggesting this could be a pathologic fracture.  Moderately severe compression fracture of L2.  There is sclerosis of the vertebral body with the possibility of metastatic disease.  No other fracture.  Patchy sclerotic changes also present at L3 and L4.  There is disc degeneration at L4-5 and L5-S1.  IMPRESSION: Fractures of L1 and L2 may be recent fractures.  These are suspicious for pathologic fractures.  Further evaluation with MRI without and with contrast suggested.   Original Report Authenticated By: Janeece Riggers, M.D.   Ct Chest W Contrast  12/20/2012   *RADIOLOGY REPORT*  Clinical Data:  Follow-up lung cancer.  Chemotherapy.  bone metastasis.  CT CHEST, ABDOMEN AND PELVIS WITH CONTRAST   Technique:  Multidetector CT imaging of the chest, abdomen and pelvis was performed following the standard protocol during bolus administration of intravenous contrast.  Contrast: OMNIPAQUE IOHEXOL 300 MG/ML  SOLN  Comparison:  MRI lumbar spine 12/13/2012  CT CHEST  Findings:  No axillary or supraclavicular lymphadenopathy.  There is several small lesions within the thyroid gland all less than 10 mm.  No mediastinal lymphadenopathy.  Postsurgical change in the right hilum.  Esophagus is normal.  Review of the lung parenchyma demonstrates linear scarring in the right upper lobe.  There is a nodule within the right upper lobe measuring 5 mm (image 17).  Adjacent smaller 3 mm nodule (image 20).  In the right lower lobe there is a posterior medial 6 mm nodule (image 23) on the right.  There is ill-defined ground-glass nodule  at the right lower lobe a (image 26).  In the right middle lobe there is a 7 mm pulmonary nodule (image 33).  Focus of ground- glass nodularity in the left upper lobe measuring 1.7 cm (image #18).  IMPRESSION:  1.  Rounded sub centimeter nodules in the right lower lobe, right middle lobe, and right upper lobe are concerning for metastasis. 2.  Ground-glass nodule in the right lower lobe and left upper lobe are indeterminate. Recommend attention on follow-up. 3.  Post surgical and radiation change in the right upper lobe.  CT ABDOMEN AND PELVIS  Findings:  There are multiple hypodense lesions within the liver consistent metastasis.  Approximate eight lesions within the left and right hepatic lobes combined.  Large lesion in the central left hepatic lobe measures 3.8 x 2.7 cm (image 61, series 2).  Lesion in the superior right hepatic lobe measures 18 mm (image 52).  Lesion in the inferior right hepatic lobe measures 22 mm.  The gallbladder, pancreas, spleen, kidneys are normal. There is thickening of the adrenal gland which is nonspecific.  The stomach, small bowel, colon unremarkable.   Abdominal aorta normal caliber.  No retroperitoneal periportal lymphadenopathy.  No mesenteric peritoneal disease.  No free fluid the pelvis.  The prostate gland is enlarged.  Bladder is normal.  Review of the bone windows demonstrates multiple mixed lytic and sclerotic lesions throughout the thoracic lumbar spine and pelvis. Compression fractures in the lumbar spine at L1 and L2 are not significantly changed from comparison MRI.  IMPRESSION:  1.  Multiple hepatic metastases within the left and right hepatic lobes. 2.  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 3.  Thickening of the left adrenal gland is nonspecific.   Original Report Authenticated By: Genevive Bi, M.D.   Mr Lumbar Spine Wo Contrast  12/13/2012   *RADIOLOGY REPORT*  Clinical Data: Chronic low back pain.  Bilateral extension to the legs.  Pain and weakness.  MRI LUMBAR SPINE WITHOUT CONTRAST  Technique:  Multiplanar and multiecho pulse sequences of the lumbar spine were obtained without intravenous contrast.  Comparison: Radiographs 12/08/2012.  Findings: The numbering convention used for this exam terms L5-S1 as the last full intervertebral disc space above the sacrum.  The marrow signal is diffusely abnormal, most compatible with infiltrative process such as myeloma or metastatic disease. Osseous lymphoma merits consideration as well.  Compression fractures of L1 and L2 are present.  The L1 compression fracture shows 50% loss of anterior vertebral body height without retropulsion. L2 shows about 70% loss of anterior vertebral body height with mild retropulsion.  There is resulting severe central stenosis at L1-L2.  The spinal cord terminates posterior to L1-L2 interspace with the retropulsed L2 superior vertebra contacting the tip of the conus medullaris.  Allowing for technical differences, there appears to be little if any change compared to the recent radiographs of 12/08/2012.   The marrow process extends into the posterior  elements, again compatible with neoplasm rather than a benign process. There appear to be fractures of the T12 and L1 spinous processes on the inversion recovery images. Cystic lesion is present in the inferior right kidney, probably a renal cyst.  There is mild levoconvex curve of the lumbar spine with the apex at L3.  T10-T11:  Partially visualized.  There is some distortion at the edge of the margin of the scan however there may be the posterior expansion of the T10 vertebra encroaching on the central canal.  A thoracic spine MRI should  be considered.  T11-T12:  Disc desiccation.  No stenosis.  T12-L1:  Disc desiccation.  Mild expansion of the pedicles associated with infiltrative marrow process with mild right foraminal encroachment.  L1-L2:  Severe central stenosis with complete effacement of the subarachnoid space.  This is predominately due to retropulsion with some contribution from posterior element expansion.  L2-L3:  Mild central stenosis associated with left eccentric broad- based disc bulging.  There is moderate left and mild right foraminal stenosis associated with short pedicles and bulging disc.  L3-L4:  Disc desiccation and loss of height.  Left foraminal and lateral protrusion.  Mild symmetric bilateral foraminal stenosis without neural compression. Moderate central stenosis associated with broad-based disc bulging and short pedicles.  L4-L5:  Severe disc degeneration.  There is some clumping of nerve roots this level, however this is probably secondary to the central stenosis at levels above.  There is moderate bilateral foraminal stenosis and mild to moderate central stenosis.  Encroachment on both lateral recesses potentially affecting the descending L5 nerves.  L5-S1:  Severe disc degeneration.  Central canal patent.  Bilateral left greater than right lateral recess encroachment associated with broad-based disc bulging and endplate osteophyte.  Moderate left and mild right foraminal stenosis.   IMPRESSION: 1.  Diffuse marrow infiltration compatible with neoplasm. Metastatic disease, myeloma, or lymphoma are primary considerations. 2.  L1 and L2 compression fractures are likely pathologic based on the infiltrative marrow process.  Greater loss of vertebral body height at L2 than at L1 with retropulsion and resulting severe central stenosis.  No cord edema.  Probable T12 and L1 spinous process fractures. 3.  Multilevel degenerative disease superimposed, detailed above.   Original Report Authenticated By: Andreas Newport, M.D.   Ct Abdomen Pelvis W Contrast  12/20/2012   *RADIOLOGY REPORT*  Clinical Data:  Follow-up lung cancer.  Chemotherapy.  bone metastasis.  CT CHEST, ABDOMEN AND PELVIS WITH CONTRAST  Technique:  Multidetector CT imaging of the chest, abdomen and pelvis was performed following the standard protocol during bolus administration of intravenous contrast.  Contrast: OMNIPAQUE IOHEXOL 300 MG/ML  SOLN  Comparison:  MRI lumbar spine 12/13/2012  CT CHEST  Findings:  No axillary or supraclavicular lymphadenopathy.  There is several small lesions within the thyroid gland all less than 10 mm.  No mediastinal lymphadenopathy.  Postsurgical change in the right hilum.  Esophagus is normal.  Review of the lung parenchyma demonstrates linear scarring in the right upper lobe.  There is a nodule within the right upper lobe measuring 5 mm (image 17).  Adjacent smaller 3 mm nodule (image 20).  In the right lower lobe there is a posterior medial 6 mm nodule (image 23) on the right.  There is ill-defined ground-glass nodule at the right lower lobe a (image 26).  In the right middle lobe there is a 7 mm pulmonary nodule (image 33).  Focus of ground- glass nodularity in the left upper lobe measuring 1.7 cm (image #18).  IMPRESSION:  1.  Rounded sub centimeter nodules in the right lower lobe, right middle lobe, and right upper lobe are concerning for metastasis. 2.  Ground-glass nodule in the right lower  lobe and left upper lobe are indeterminate. Recommend attention on follow-up. 3.  Post surgical and radiation change in the right upper lobe.  CT ABDOMEN AND PELVIS  Findings:  There are multiple hypodense lesions within the liver consistent metastasis.  Approximate eight lesions within the left and right hepatic lobes combined.  Large lesion  in the central left hepatic lobe measures 3.8 x 2.7 cm (image 61, series 2).  Lesion in the superior right hepatic lobe measures 18 mm (image 52).  Lesion in the inferior right hepatic lobe measures 22 mm.  The gallbladder, pancreas, spleen, kidneys are normal. There is thickening of the adrenal gland which is nonspecific.  The stomach, small bowel, colon unremarkable.  Abdominal aorta normal caliber.  No retroperitoneal periportal lymphadenopathy.  No mesenteric peritoneal disease.  No free fluid the pelvis.  The prostate gland is enlarged.  Bladder is normal.  Review of the bone windows demonstrates multiple mixed lytic and sclerotic lesions throughout the thoracic lumbar spine and pelvis. Compression fractures in the lumbar spine at L1 and L2 are not significantly changed from comparison MRI.  IMPRESSION:  1.  Multiple hepatic metastases within the left and right hepatic lobes. 2.  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 3.  Thickening of the left adrenal gland is nonspecific.   Original Report Authenticated By: Genevive Bi, M.D.    ASSESSMENT: Patient is a 67 year old male with h/o NSCLC that has been heavily treated at the [facility].  Awaiting official records.  He is in severe back pain worrisome for cord compression.  PLAN:   For his back pain and toe numbness I ordered a STAT MRI of the spine.  He will receive 20mg  IV Dexamethasone in the infusion room followed by Dexamethasone 8mg  PO Q8H.  He will take Flexeril 5mg  TID PRN for the muscle spasms, and I refilled his Percocet for the pain.  I also ordered a referral to radiation oncology.  Following  the MRI it found an impending cord compression and he was sent down to radiation oncology to see Dr. Roselind Messier to be evaluation for stat radiation.    All questions were answered. The patient knows to call the clinic with any problems, questions or concerns. We can certainly see the patient much sooner if necessary.  I spent 60 minutes counseling the patient face to face. The total time spent in the appointment was 60 minutes.  Cherie Ouch Lyn Hollingshead, NP Medical Oncology [facility] Phone: [phone] 12/31/2012, 8:28 AM  ATTENDING'S ATTESTATION:  I personally reviewed patient's chart, examined patient myself, formulated the treatment plan as followed.    This is a patient of Dr. Shirline Frees one of my partners. Patient came in with lower back pain and leg spasms. An extensive workup was done by S. Today. Patient in fact does have 2 beginning as of spinal cord compression. I personally spoke to Dr. Antony Blackbird from radiation oncology regarding this patient. We proceeded to get stat MRIs and referred him to Dr. Sharlett Iles to who has kindly started radiation therapy.in the meantime patient will also be on corticosteroids specifically dexamethasone.  Upon Dr. Rebecca Eaton return patient will be seeing him. I spent total of 40-60 minutes with this patient and discussion and coordination of care

## 2012-12-29 NOTE — Patient Instructions (Addendum)
I have given the following prescriptions:  Dexamethasone 8mg  by mouth three times a day.  Percocet (Oxycodone/Acetominophen) 1-2 tablets every four hours as needed.   Flexeril (for muscle spasms) 5 mg three times a day as needed.    I also referred you to radiation oncology.  Please call us if you have any questions or concerns.    We will see you back in one week.

## 2012-12-29 NOTE — Progress Notes (Signed)
Radiation Oncology         [phone] ________________________________  Initial outpatient Consultation  Name: Billy Collier MRN: [id number]  Date: 12/29/2012  DOB: 03/06/1946  WU:[id number] Not In System  Si Gaul, MD , Drue Second, MD  REFERRING PHYSICIAN: Si Gaul, MD  DIAGNOSIS: Metastatic lung cancer  HISTORY OF PRESENT ILLNESS::Billy Collier is a 67 y.o. male who is seen at the courtesy of Dr. Drue Second for an opinion concerning radiation therapy as part of management of patient's progressive lung cancer. The patient has had previous treatment for what sounds like right lung cancer at the [facility] in [place] and [place].  the patient is been told no further therapy can be given for his advancing lung cancer. The patient has been having progressive low back pain and wished to have a second opinion in the [facility] health system. He was recently seen by Dr. Arbutus Ped and MRI of the lumbar spine was ordered which is documented below. Today the patient presented with worsening back pain and was seen by Dr. Welton Flakes.  A thoracic MRI was obtained which is also documented below. In light of  the patient's pain and x-ray findings,  he is now seen urgently in radiation oncology for consideration for treatment to the lower thoracic and upper lumbar spine area.Marland Kitchen  PREVIOUS RADIATION THERAPY: Yes radiation to the right chest within the [place] system. Details are pending at this time.  PAST MEDICAL HISTORY:  has a past medical history of Hypertension; PTSD (post-traumatic stress disorder); Chronic back pain; Cancer; and Lung cancer.    PAST SURGICAL HISTORY:History reviewed. No pertinent past surgical history.  FAMILY HISTORY: family history is not on file.  SOCIAL HISTORY:  reports that he has been smoking Cigarettes.  He has been smoking about 0.00 packs per day. He does not have any smokeless tobacco history on file. He reports that he does not drink alcohol or use illicit  drugs.  ALLERGIES: Review of patient's allergies indicates no known allergies.  MEDICATIONS:  Current Outpatient Prescriptions  Medication Sig Dispense Refill  . cyclobenzaprine (FLEXERIL) 5 MG tablet Take 1 tablet (5 mg total) by mouth 3 (three) times daily as needed for muscle spasms.  30 tablet  0  . dexamethasone (DECADRON) 4 MG tablet Take 2 tablets (8 mg total) by mouth every 8 (eight) hours.  90 tablet  0  . diazepam (VALIUM) 5 MG tablet Take 1 tablet (5 mg total) by mouth every 8 (eight) hours as needed (muscle spasm).  15 tablet  0  . ENSURE (ENSURE) Take 237 mLs by mouth continuous dialysis.      Marland Kitchen ibuprofen (ADVIL,MOTRIN) 600 MG tablet Take 1 tablet (600 mg total) by mouth every 6 (six) hours as needed for pain.  30 tablet  0  . lisinopril (PRINIVIL,ZESTRIL) 10 MG tablet Take 25 mg by mouth daily.      . magnesium oxide (MAG-OX) 400 MG tablet Take 400 mg by mouth 2 (two) times daily.      Marland Kitchen oxyCODONE-acetaminophen (PERCOCET/ROXICET) 5-325 MG per tablet Take 1-2 tablets by mouth every 4 (four) hours as needed for pain.  90 tablet  0  . Simethicone (GAS-X PO) Take 1 tablet by mouth daily as needed (gas).       No current facility-administered medications for this encounter.    REVIEW OF SYSTEMS:  A 15 point review of systems is documented in the electronic medical record. This was obtained by the nursing staff. However, I reviewed this  with the patient to discuss relevant findings and make appropriate changes. The patient reports pain in the lower back area which is aggressively worsening. He has some numbness and tingling in his lower extremities. He is able to walk at this time as great pain with walking. He denies any bowel or bladder incontinence. He denies any headaches or double vision.   PHYSICAL EXAM:  temperature is 97.8 F (36.6 C). His blood pressure is 124/90 and his pulse is 95.   BP 124/90  Pulse 95  Temp(Src) 97.8 F (36.6 C)  General Appearance:    Alert,  cooperative, no distress, appears stated age, accompanied by his sister on evaluation today   Head:    Normocephalic, without obvious abnormality, atraumatic  Eyes:    PERRL, conjunctiva/corneas clear, EOM's intact,           Nose:   Nares normal, septum midline, mucosa normal, no drainage    or sinus tenderness  Throat:   Lips, mucosa, and tongue normal; dentures in place, gums normal  Neck:   Supple, symmetrical, trachea midline, no adenopathy;       thyroid:  No enlargement/tenderness/nodules; no carotid   bruit or JVD  Back:     Symmetric, no curvature, ROM normal, no CVA tenderness, he is tender with palpation in the upper lumbar spine area.   Lungs:     Clear to auscultation bilaterally, respirations unlabored  Chest wall:    No tenderness or deformity,  no visible tattoos present or obvious radiation changes along the right chest area   Heart:    Regular rate and rhythm, S1 and S2 normal, no murmur, rub     Abdomen:     Soft, non-tender, bowel sounds active all four quadrants,    no masses, no organomegaly        Extremities:   Extremities normal, atraumatic, no cyanosis or edema  Pulses:   2+ and symmetric all extremities  Skin:   Skin color, texture, turgor normal, no rashes or lesions  Lymph nodes:   Cervical, supraclavicular, and axillary nodes normal  Neurologic:   Normal strength, sensation and reflexes      throughout    KPS = 60  100 - Normal; no complaints; no evidence of disease. 90   - Able to carry on normal activity; minor signs or symptoms of disease. 80   - Normal activity with effort; some signs or symptoms of disease. 38   - Cares for self; unable to carry on normal activity or to do active work. 60   - Requires occasional assistance, but is able to care for most of his personal needs. 50   - Requires considerable assistance and frequent medical care. 40   - Disabled; requires special care and assistance. 30   - Severely disabled; hospital admission is  indicated although death not imminent. 20   - Very sick; hospital admission necessary; active supportive treatment necessary. 10   - Moribund; fatal processes progressing rapidly. 0     - Dead  Karnofsky DA, Abelmann WH, Craver LS and Burchenal [facility] [phone]) The use of the nitrogen mustards in the palliative treatment of carcinoma: with particular reference to bronchogenic carcinoma Cancer 1 [id number]  LABORATORY DATA:  Lab Results  Component Value Date   WBC 8.0 12/29/2012   HGB 11.9* 12/29/2012   HCT 33.1* 12/29/2012   MCV 85.8 12/29/2012   PLT 115* 12/29/2012   Lab Results  Component Value Date   NA 128* 12/29/2012  K 3.8 12/29/2012   CO2 26 12/29/2012   Lab Results  Component Value Date   ALT 27 12/29/2012   AST 36* 12/29/2012   ALKPHOS 210* 12/29/2012   BILITOT 0.59 12/29/2012     RADIOGRAPHY: Dg Lumbar Spine Complete  12/08/2012   *RADIOLOGY REPORT*  Clinical Data: Back pain.  Lung cancer.  LUMBAR SPINE - COMPLETE 4+ VIEW  Comparison: None  Findings: Mild compression fracture of L1.  There is patchy sclerosis of the vertebral bodies suggesting this could be a pathologic fracture.  Moderately severe compression fracture of L2.  There is sclerosis of the vertebral body with the possibility of metastatic disease.  No other fracture.  Patchy sclerotic changes also present at L3 and L4.  There is disc degeneration at L4-5 and L5-S1.  IMPRESSION: Fractures of L1 and L2 may be recent fractures.  These are suspicious for pathologic fractures.  Further evaluation with MRI without and with contrast suggested.   Original Report Authenticated By: Janeece Riggers, M.D.   Ct Chest W Contrast  12/20/2012   *RADIOLOGY REPORT*  Clinical Data:  Follow-up lung cancer.  Chemotherapy.  bone metastasis.  CT CHEST, ABDOMEN AND PELVIS WITH CONTRAST  Technique:  Multidetector CT imaging of the chest, abdomen and pelvis was performed following the standard protocol during bolus administration of intravenous contrast.   Contrast: OMNIPAQUE IOHEXOL 300 MG/ML  SOLN  Comparison:  MRI lumbar spine 12/13/2012  CT CHEST  Findings:  No axillary or supraclavicular lymphadenopathy.  There is several small lesions within the thyroid gland all less than 10 mm.  No mediastinal lymphadenopathy.  Postsurgical change in the right hilum.  Esophagus is normal.  Review of the lung parenchyma demonstrates linear scarring in the right upper lobe.  There is a nodule within the right upper lobe measuring 5 mm (image 17).  Adjacent smaller 3 mm nodule (image 20).  In the right lower lobe there is a posterior medial 6 mm nodule (image 23) on the right.  There is ill-defined ground-glass nodule at the right lower lobe a (image 26).  In the right middle lobe there is a 7 mm pulmonary nodule (image 33).  Focus of ground- glass nodularity in the left upper lobe measuring 1.7 cm (image #18).  IMPRESSION:  1.  Rounded sub centimeter nodules in the right lower lobe, right middle lobe, and right upper lobe are concerning for metastasis. 2.  Ground-glass nodule in the right lower lobe and left upper lobe are indeterminate. Recommend attention on follow-up. 3.  Post surgical and radiation change in the right upper lobe.  CT ABDOMEN AND PELVIS  Findings:  There are multiple hypodense lesions within the liver consistent metastasis.  Approximate eight lesions within the left and right hepatic lobes combined.  Large lesion in the central left hepatic lobe measures 3.8 x 2.7 cm (image 61, series 2).  Lesion in the superior right hepatic lobe measures 18 mm (image 52).  Lesion in the inferior right hepatic lobe measures 22 mm.  The gallbladder, pancreas, spleen, kidneys are normal. There is thickening of the adrenal gland which is nonspecific.  The stomach, small bowel, colon unremarkable.  Abdominal aorta normal caliber.  No retroperitoneal periportal lymphadenopathy.  No mesenteric peritoneal disease.  No free fluid the pelvis.  The prostate gland is enlarged.   Bladder is normal.  Review of the bone windows demonstrates multiple mixed lytic and sclerotic lesions throughout the thoracic lumbar spine and pelvis. Compression fractures in the lumbar spine at  L1 and L2 are not significantly changed from comparison MRI.  IMPRESSION:  1.  Multiple hepatic metastases within the left and right hepatic lobes. 2.  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 3.  Thickening of the left adrenal gland is nonspecific.   Original Report Authenticated By: Genevive Bi, M.D.   Mr Lumbar Spine Wo Contrast  12/13/2012   *RADIOLOGY REPORT*  Clinical Data: Chronic low back pain.  Bilateral extension to the legs.  Pain and weakness.  MRI LUMBAR SPINE WITHOUT CONTRAST  Technique:  Multiplanar and multiecho pulse sequences of the lumbar spine were obtained without intravenous contrast.  Comparison: Radiographs 12/08/2012.  Findings: The numbering convention used for this exam terms L5-S1 as the last full intervertebral disc space above the sacrum.  The marrow signal is diffusely abnormal, most compatible with infiltrative process such as myeloma or metastatic disease. Osseous lymphoma merits consideration as well.  Compression fractures of L1 and L2 are present.  The L1 compression fracture shows 50% loss of anterior vertebral body height without retropulsion. L2 shows about 70% loss of anterior vertebral body height with mild retropulsion.  There is resulting severe central stenosis at L1-L2.  The spinal cord terminates posterior to L1-L2 interspace with the retropulsed L2 superior vertebra contacting the tip of the conus medullaris.  Allowing for technical differences, there appears to be little if any change compared to the recent radiographs of 12/08/2012.   The marrow process extends into the posterior elements, again compatible with neoplasm rather than a benign process. There appear to be fractures of the T12 and L1 spinous processes on the inversion recovery images. Cystic  lesion is present in the inferior right kidney, probably a renal cyst.  There is mild levoconvex curve of the lumbar spine with the apex at L3.  T10-T11:  Partially visualized.  There is some distortion at the edge of the margin of the scan however there may be the posterior expansion of the T10 vertebra encroaching on the central canal.  A thoracic spine MRI should be considered.  T11-T12:  Disc desiccation.  No stenosis.  T12-L1:  Disc desiccation.  Mild expansion of the pedicles associated with infiltrative marrow process with mild right foraminal encroachment.  L1-L2:  Severe central stenosis with complete effacement of the subarachnoid space.  This is predominately due to retropulsion with some contribution from posterior element expansion.  L2-L3:  Mild central stenosis associated with left eccentric broad- based disc bulging.  There is moderate left and mild right foraminal stenosis associated with short pedicles and bulging disc.  L3-L4:  Disc desiccation and loss of height.  Left foraminal and lateral protrusion.  Mild symmetric bilateral foraminal stenosis without neural compression. Moderate central stenosis associated with broad-based disc bulging and short pedicles.  L4-L5:  Severe disc degeneration.  There is some clumping of nerve roots this level, however this is probably secondary to the central stenosis at levels above.  There is moderate bilateral foraminal stenosis and mild to moderate central stenosis.  Encroachment on both lateral recesses potentially affecting the descending L5 nerves.  L5-S1:  Severe disc degeneration.  Central canal patent.  Bilateral left greater than right lateral recess encroachment associated with broad-based disc bulging and endplate osteophyte.  Moderate left and mild right foraminal stenosis.  IMPRESSION: 1.  Diffuse marrow infiltration compatible with neoplasm. Metastatic disease, myeloma, or lymphoma are primary considerations. 2.  L1 and L2 compression fractures  are likely pathologic based on the infiltrative marrow process.  Greater loss of  vertebral body height at L2 than at L1 with retropulsion and resulting severe central stenosis.  No cord edema.  Probable T12 and L1 spinous process fractures. 3.  Multilevel degenerative disease superimposed, detailed above.   Original Report Authenticated By: Andreas Newport, M.D.   Mr Thoracic Spine W Wo Contrast  12/29/2012   CLINICAL DATA:  67 year old male with metastatic lung cancer. Pathologic compression fractures in the lumbar spine with spinal stenosis. Severe back pain. New onset of lower extremity numbness and difficulty walking.  EXAM: MRI THORACIC SPINE WITHOUT AND WITH CONTRAST  TECHNIQUE: Multiplanar and multiecho pulse sequences of the thoracic spine were obtained without and with intravenous contrast.  CONTRAST:  12mL MULTIHANCE GADOBENATE DIMEGLUMINE 529 MG/ML IV SOLN  COMPARISON:  Lumbar MRI 12/13/2012.  Chest CT 12/20/2012.  FINDINGS: Thoracic numbering agrees with the 12/13/2012 lumbar study. Limited sagittal imaging of the cervical spine suggests mild if any cervical osseous metastatic disease.  Advanced thoracic osseous metastases. Only of the T1 and T8 vertebral bodies appear to remain spared at this time. Multilevel posterior rib metastases. Multilevel thoracic posterior element metastases. There is a moderate pathologic compression fracture of T10 with diffuse vertebral involvement by tumor at that level and confluent medial right 10th rib involvement. There is mild epidural extension of tumor and mild retropulsion of the posterior vertebral body resulting in mild spinal stenosis at this level. No associated spinal cord signal abnormality.  In the upper lumbar spine epidural tumor is severe at L2, and today's post-contrast images demonstrate associated confluent abnormal dural thickening and enhancement tracking cephalad from the lumbar spine to the posterior T10 level. Subsequent moderate to severe spinal  stenosis at the superior L2 level which appears to correspond to the conus medullaris. Still, no definite conus or lower thoracic spinal cord signal abnormality.  Normal thoracic spine thecal sac patency above the T10 level. Mild ventral epidural tumor at T11 without significant spinal stenosis. Mild circumferential epidural tumor at T12 with borderline to mild spinal stenosis. No abnormal intradural enhancement identified.  Abnormal lung parenchyma, appears stable since 12/20/12. Trace partially loculated right pleural effusion.  IMPRESSION: 1. 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 Today's post-contrast images demonstrate extension of dural tumor into the lower thoracic spine as far as the T10 level.  2. Widespread thoracic metastatic disease. T10 moderate pathologic compression fracture. 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  Study discussed by telephone with Dr. Welton Flakes on 12/29/2012 at 1508 hr.   Electronically Signed   By: Augusto Gamble   On: 12/29/2012 15:10   Ct Abdomen Pelvis W Contrast  12/20/2012   *RADIOLOGY REPORT*  Clinical Data:  Follow-up lung cancer.  Chemotherapy.  bone metastasis.  CT CHEST, ABDOMEN AND PELVIS WITH CONTRAST  Technique:  Multidetector CT imaging of the chest, abdomen and pelvis was performed following the standard protocol during bolus administration of intravenous contrast.  Contrast: OMNIPAQUE IOHEXOL 300 MG/ML  SOLN  Comparison:  MRI lumbar spine 12/13/2012  CT CHEST  Findings:  No axillary or supraclavicular lymphadenopathy.  There is several small lesions within the thyroid gland all less than 10 mm.  No mediastinal lymphadenopathy.  Postsurgical change in the right hilum.  Esophagus is normal.  Review of the lung parenchyma demonstrates linear scarring in the right upper lobe.  There  is a nodule within  the right upper lobe measuring 5 mm (image 17).  Adjacent smaller 3 mm nodule (image 20).  In the right lower lobe there is a posterior medial 6 mm nodule (image 23) on the right.  There is ill-defined ground-glass nodule at the right lower lobe a (image 26).  In the right middle lobe there is a 7 mm pulmonary nodule (image 33).  Focus of ground- glass nodularity in the left upper lobe measuring 1.7 cm (image #18).  IMPRESSION:  1.  Rounded sub centimeter nodules in the right lower lobe, right middle lobe, and right upper lobe are concerning for metastasis. 2.  Ground-glass nodule in the right lower lobe and left upper lobe are indeterminate. Recommend attention on follow-up. 3.  Post surgical and radiation change in the right upper lobe.  CT ABDOMEN AND PELVIS  Findings:  There are multiple hypodense lesions within the liver consistent metastasis.  Approximate eight lesions within the left and right hepatic lobes combined.  Large lesion in the central left hepatic lobe measures 3.8 x 2.7 cm (image 61, series 2).  Lesion in the superior right hepatic lobe measures 18 mm (image 52).  Lesion in the inferior right hepatic lobe measures 22 mm.  The gallbladder, pancreas, spleen, kidneys are normal. There is thickening of the adrenal gland which is nonspecific.  The stomach, small bowel, colon unremarkable.  Abdominal aorta normal caliber.  No retroperitoneal periportal lymphadenopathy.  No mesenteric peritoneal disease.  No free fluid the pelvis.  The prostate gland is enlarged.  Bladder is normal.  Review of the bone windows demonstrates multiple mixed lytic and sclerotic lesions throughout the thoracic lumbar spine and pelvis. Compression fractures in the lumbar spine at L1 and L2 are not significantly changed from comparison MRI.  IMPRESSION:  1.  Multiple hepatic metastases within the left and right hepatic lobes. 2.  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 3.  Thickening of the left  adrenal gland is nonspecific.   Original Report Authenticated By: Genevive Bi, M.D.      IMPRESSION: Metastatic lung cancer. Prior treatment details are pending at this time from the [facility]. The patient is quite symptomatic with his disease in the lower thoracic and upper lumbar spine area. MRI today shows compression of the conus medullaris.   He would be a good candidate for urgent treatment to this area in light of epidural tumor and impending cord impingement and possibly paralysis without treatment. We have requested medical records from the [facility] on urgent basis as this may factor into whether he can have a full palliative dose of radiation therapy to the spine region.  Today I discussed the  side effects toxicities and expected benefits of palliative radiation therapy in this situation with the patient and his sister. Patient appears to understand and wishes to proceed with treatment.  PLAN: Simulation and planning today. Patient will begin his treatments tomorrow. Anticipate 14 treatments directed at the lower thoracic and upper lumbar spine area. The patient will continue on Decadron. I spent 60 minutes minutes face to face with the patient and more than 50% of that time was spent in counseling and/or coordination of care.   ------------------------------------------------  -----------------------------------  [name], PhD, MD

## 2012-12-30 ENCOUNTER — Telehealth: Payer: Self-pay | Admitting: Oncology

## 2012-12-30 ENCOUNTER — Ambulatory Visit: Payer: Medicare Other | Admitting: Oncology

## 2012-12-30 ENCOUNTER — Ambulatory Visit
Admission: RE | Admit: 2012-12-30 | Discharge: 2012-12-30 | Disposition: A | Discharge status: Home / Self Care | Payer: Medicare Other | Source: Ambulatory Visit | Attending: Radiation Oncology | Admitting: Radiation Oncology

## 2012-12-30 ENCOUNTER — Telehealth: Payer: Self-pay | Admitting: Internal Medicine

## 2012-12-30 NOTE — Telephone Encounter (Signed)
Called and spoke to [place] at the [place] in [place], [place].  Asked her how to get past radiation records for [facility] as soon as possible.  She said to fax the release of information form and mark it urgent.  They will try to get the records back to [place] today.  Form faxed to [phone].

## 2012-12-30 NOTE — Telephone Encounter (Signed)
, °

## 2013-01-03 ENCOUNTER — Ambulatory Visit
Admission: RE | Admit: 2013-01-03 | Discharge: 2013-01-03 | Disposition: A | Discharge status: Home / Self Care | Payer: Medicare Other | Source: Ambulatory Visit | Attending: Radiation Oncology | Admitting: Radiation Oncology

## 2013-01-04 ENCOUNTER — Encounter: Payer: Self-pay | Admitting: Radiation Oncology

## 2013-01-04 ENCOUNTER — Ambulatory Visit
Admission: RE | Admit: 2013-01-04 | Discharge: 2013-01-04 | Disposition: A | Discharge status: Home / Self Care | Payer: Medicare Other | Source: Ambulatory Visit | Attending: Radiation Oncology | Admitting: Radiation Oncology

## 2013-01-04 ENCOUNTER — Ambulatory Visit (HOSPITAL_BASED_OUTPATIENT_CLINIC_OR_DEPARTMENT_OTHER): Payer: Medicare Other | Admitting: Internal Medicine

## 2013-01-04 ENCOUNTER — Encounter: Payer: Self-pay | Admitting: Internal Medicine

## 2013-01-04 ENCOUNTER — Telehealth: Payer: Self-pay | Admitting: Internal Medicine

## 2013-01-04 DIAGNOSIS — C3490 Malignant neoplasm of unspecified part of unspecified bronchus or lung: Secondary | ICD-10-CM

## 2013-01-04 DIAGNOSIS — C787 Secondary malignant neoplasm of liver and intrahepatic bile duct: Secondary | ICD-10-CM

## 2013-01-04 DIAGNOSIS — C7951 Secondary malignant neoplasm of bone: Secondary | ICD-10-CM

## 2013-01-04 NOTE — Telephone Encounter (Signed)
gv and printed appt sched and avs for pt for SEpt. °

## 2013-01-04 NOTE — Progress Notes (Signed)
Pt c/o mild to moderate pain in his hip joints. He states this is not new pain. Pt c/o pain, cramps in back of thighs when he gets up to walk. Pt taking Oxycodone 2 tabs every 4 hours. Pt taking Flexeril regularly. Pt denies pain in his back today. He c/o constipation, took laxative 2 days ago w/good results. Advised he begin stool softeners, drink prune juice or eat prunes instead of relying on laxative.  Pt denies fatigue, loss of appetite.

## 2013-01-04 NOTE — Progress Notes (Signed)
[facility]    Radiation Oncology [address] [place]     Maryln Gottron, M.D. [place], [place] [address]               [name], M.D., Ph.D. Phone: [phone]      Molli Hazard A. Kathrynn Running, M.D. Fax: [phone]      Radene Gunning, M.D., Ph.D.         Lurline Hare, M.D.         Grayland Jack, M.D Weekly Treatment Management Note  Name: Billy Collier     MRN: [id number]        CSN: [id number] Date: 01/04/2013      DOB: 08-26-1945  CC: Provider Not In System         No ref. provider found    Status: Outpatient  Diagnosis: The encounter diagnosis was Lung cancer, right.  Current Dose: 7.5 Gy  Current Fraction: 3  Planned Dose: 10 + Gy  Narrative: Billy Collier was seen today for weekly treatment management. The chart was checked and port films  were reviewed. He is tolerating his treatments well without any nausea. He has had some problems with constipation and will pickup some Colace. He denies any significant weakness in his lower extremities. He is walking in for his treatments here.  Review of patient's allergies indicates no known allergies.  Current Outpatient Prescriptions  Medication Sig Dispense Refill  . cyclobenzaprine (FLEXERIL) 5 MG tablet Take 1 tablet (5 mg total) by mouth 3 (three) times daily as needed for muscle spasms.  30 tablet  0  . dexamethasone (DECADRON) 4 MG tablet Take 2 tablets (8 mg total) by mouth every 8 (eight) hours.  90 tablet  0  . diazepam (VALIUM) 5 MG tablet Take 1 tablet (5 mg total) by mouth every 8 (eight) hours as needed (muscle spasm).  15 tablet  0  . ENSURE (ENSURE) Take 237 mLs by mouth continuous dialysis.      Marland Kitchen ibuprofen (ADVIL,MOTRIN) 600 MG tablet Take 1 tablet (600 mg total) by mouth every 6 (six) hours as needed for pain.  30 tablet  0  . lisinopril (PRINIVIL,ZESTRIL) 10 MG tablet Take 25 mg by mouth daily.      . magnesium oxide (MAG-OX) 400 MG tablet Take 400 mg by mouth 2 (two) times daily.      Marland Kitchen  oxyCODONE-acetaminophen (PERCOCET/ROXICET) 5-325 MG per tablet Take 1-2 tablets by mouth every 4 (four) hours as needed for pain.  90 tablet  0  . Simethicone (GAS-X PO) Take 1 tablet by mouth daily as needed (gas).       No current facility-administered medications for this encounter.   Labs:  Lab Results  Component Value Date   WBC 8.0 12/29/2012   HGB 11.9* 12/29/2012   HCT 33.1* 12/29/2012   MCV 85.8 12/29/2012   PLT 115* 12/29/2012   Lab Results  Component Value Date   CREATININE 0.8 12/29/2012   BUN 8.5 12/29/2012   NA 128* 12/29/2012   K 3.8 12/29/2012   CO2 26 12/29/2012   Lab Results  Component Value Date   ALT 27 12/29/2012   AST 36* 12/29/2012   BILITOT 0.59 12/29/2012    Physical Examination:  weight is 129 lb 9.6 oz (58.786 kg). His temperature is 98.3 F (36.8 C). His blood pressure is 127/92 and his pulse is 111. His respiration is 20.    Wt Readings from Last 3 Encounters:  01/04/13 129 lb  12.8 oz (58.877 kg)  01/04/13 129 lb 9.6 oz (58.786 kg)  12/29/12 134 lb 6.4 oz (60.963 kg)    Lower motor strength is 5 out of 5 in the proximal and distal muscle groups. Lungs - Normal respiratory effort, chest expands symmetrically. Lungs are clear to auscultation, no crackles or wheezes.  Heart has regular rhythm and rate  Abdomen is soft and non tender with normal bowel sounds  Assessment:  Patient tolerating treatments well  Plan: Continue treatment per original radiation prescription.  We continue to have difficulties in obtaining the patient's previous radiation therapy records from the [place] system. I am unsure whether his spinal cord was treated with his previous treatments. I have therefore limited  his treatment to 4 treatments until further information can be obtained concerning this issue.

## 2013-01-04 NOTE — Progress Notes (Signed)
[facility] [phone]   [phone]  OFFICE PROGRESS NOTE  Provider Not In System [address] [place] [address]  DIAGNOSIS: Metastatic non-small cell lung cancer, adenocarcinoma with negative EGFR mutation and negative ALK gene translocation as well as negative KRAS initially diagnosed as stage IIIB (T3, N3, M0) in June of 2012  PRIOR THERAPY:( done at the [facility], in [place], [place] ). 1) status post concurrent chemoradiation with cisplatin and etoposide for 2 cycles started November of 2012 and completed January of 2013. 2) status post 4 cycles of systemic chemotherapy was carboplatin, paclitaxel and Avastin for metastatic disease completed in 05/06/2012 with disease progression. 3) status post 2 cycles of maintenance Avastin 15 mg/kg, last dose was given 07/13/2012 discontinued secondary to disease progression. 4) status post 2 cycles of systemic chemotherapy with single agent Alimta 500 mg/M2 last dose was given in May of 2014 discontinued secondary to disease progression  CURRENT THERAPY: Palliative radiotherapy to the progressive metastatic bone disease with cord compression in the lower thoracic and upper lumbar spine area under the care of Dr. Roselind Messier.  INTERVAL HISTORY: Billy Collier 67 y.o. male returns to the clinic today for followup visit. The patient continues to complain of low back pain. He is currently undergoing palliative radiotherapy to the lower thoracic and upper lumbar spines under the care of Dr. Roselind Messier completed 3 fractions. He is currently on pain management in the form of Percocet 5/325 mg, 1-2 tablets every 4 hours as needed. He also takes ibuprofen 600 mg every 6 hours on as-needed basis. The patient was also started on Decadron 4 mg by mouth every 8 hours. He denied having any other significant complaints. I ordered restaging scan of the chest, abdomen and pelvis recently for evaluation of his disease  and he is here for discussion of his scan results and recommendation regarding treatment of his condition. He denied having any significant fever or chills. He denied having any chest pain, shortness of breath, cough or hemoptysis. He has no nausea or vomiting.  MEDICAL HISTORY: Past Medical History  Diagnosis Date  . Hypertension   . PTSD (post-traumatic stress disorder)   . Chronic back pain   . Cancer   . Lung cancer     ALLERGIES:  has No Known Allergies.  MEDICATIONS:  Current Outpatient Prescriptions  Medication Sig Dispense Refill  . cyclobenzaprine (FLEXERIL) 5 MG tablet Take 1 tablet (5 mg total) by mouth 3 (three) times daily as needed for muscle spasms.  30 tablet  0  . dexamethasone (DECADRON) 4 MG tablet Take 2 tablets (8 mg total) by mouth every 8 (eight) hours.  90 tablet  0  . diazepam (VALIUM) 5 MG tablet Take 1 tablet (5 mg total) by mouth every 8 (eight) hours as needed (muscle spasm).  15 tablet  0  . ENSURE (ENSURE) Take 237 mLs by mouth continuous dialysis.      Marland Kitchen ibuprofen (ADVIL,MOTRIN) 600 MG tablet Take 1 tablet (600 mg total) by mouth every 6 (six) hours as needed for pain.  30 tablet  0  . lisinopril (PRINIVIL,ZESTRIL) 10 MG tablet Take 25 mg by mouth daily.      . magnesium oxide (MAG-OX) 400 MG tablet Take 400 mg by mouth 2 (two) times daily.      Marland Kitchen oxyCODONE-acetaminophen (PERCOCET/ROXICET) 5-325 MG per tablet Take 1-2 tablets by mouth every 4 (four) hours as needed for pain.  90 tablet  0  . Simethicone (GAS-X PO) Take 1 tablet by mouth daily as needed (gas).       No current facility-administered medications for this visit.    REVIEW OF SYSTEMS:  A comprehensive review of systems was negative except for: Constitutional: positive for fatigue Musculoskeletal: positive for back pain   PHYSICAL EXAMINATION: General appearance: alert, cooperative, fatigued and no distress Head: Normocephalic, without obvious abnormality, atraumatic Neck: no  adenopathy Lymph nodes: Cervical, supraclavicular, and axillary nodes normal. Resp: clear to auscultation bilaterally Back: negative, symmetric, no curvature. ROM normal. No CVA tenderness. Cardio: regular rate and rhythm, S1, S2 normal, no murmur, click, rub or gallop GI: soft, non-tender; bowel sounds normal; no masses,  no organomegaly Extremities: extremities normal, atraumatic, no cyanosis or edema Neurologic: Alert and oriented X 3, normal strength and tone. Normal symmetric reflexes. Normal coordination and gait  ECOG PERFORMANCE STATUS: 1 - Symptomatic but completely ambulatory  Blood pressure 122/90, pulse 107, temperature 98 F (36.7 C), temperature source Oral, resp. rate 19, height 5\' 8"  (1.727 m), weight 129 lb 12.8 oz (58.877 kg), SpO2 100.00%.  LABORATORY DATA: Lab Results  Component Value Date   WBC 8.0 12/29/2012   HGB 11.9* 12/29/2012   HCT 33.1* 12/29/2012   MCV 85.8 12/29/2012   PLT 115* 12/29/2012      Chemistry      Component Value Date/Time   NA 128* 12/29/2012 1105   K 3.8 12/29/2012 1105   CO2 26 12/29/2012 1105   BUN 8.5 12/29/2012 1105   CREATININE 0.8 12/29/2012 1105      Component Value Date/Time   CALCIUM 10.1 12/29/2012 1105   ALKPHOS 210* 12/29/2012 1105   AST 36* 12/29/2012 1105   ALT 27 12/29/2012 1105   BILITOT 0.59 12/29/2012 1105       RADIOGRAPHIC STUDIES: Dg Lumbar Spine Complete  12/08/2012   *RADIOLOGY REPORT*  Clinical Data: Back pain.  Lung cancer.  LUMBAR SPINE - COMPLETE 4+ VIEW  Comparison: None  Findings: Mild compression fracture of L1.  There is patchy sclerosis of the vertebral bodies suggesting this could be a pathologic fracture.  Moderately severe compression fracture of L2.  There is sclerosis of the vertebral body with the possibility of metastatic disease.  No other fracture.  Patchy sclerotic changes also present at L3 and L4.  There is disc degeneration at L4-5 and L5-S1.  IMPRESSION: Fractures of L1 and L2 may be recent  fractures.  These are suspicious for pathologic fractures.  Further evaluation with MRI without and with contrast suggested.   Original Report Authenticated By: Janeece Riggers, M.D.   Ct Chest W Contrast  12/20/2012   *RADIOLOGY REPORT*  Clinical Data:  Follow-up lung cancer.  Chemotherapy.  bone metastasis.  CT CHEST, ABDOMEN AND PELVIS WITH CONTRAST  Technique:  Multidetector CT imaging of the chest, abdomen and pelvis was performed following the standard protocol during bolus administration of intravenous contrast.  Contrast: OMNIPAQUE IOHEXOL 300 MG/ML  SOLN  Comparison:  MRI lumbar spine 12/13/2012  CT CHEST  Findings:  No axillary or supraclavicular lymphadenopathy.  There is several small lesions within the thyroid gland all less than 10 mm.  No mediastinal lymphadenopathy.  Postsurgical change in the right hilum.  Esophagus is normal.  Review of the lung parenchyma demonstrates linear scarring in the right upper lobe.  There is a nodule within the right upper lobe measuring 5 mm (image 17).  Adjacent smaller 3 mm nodule (image 20).  In the right  lower lobe there is a posterior medial 6 mm nodule (image 23) on the right.  There is ill-defined ground-glass nodule at the right lower lobe a (image 26).  In the right middle lobe there is a 7 mm pulmonary nodule (image 33).  Focus of ground- glass nodularity in the left upper lobe measuring 1.7 cm (image #18).  IMPRESSION:  1.  Rounded sub centimeter nodules in the right lower lobe, right middle lobe, and right upper lobe are concerning for metastasis. 2.  Ground-glass nodule in the right lower lobe and left upper lobe are indeterminate. Recommend attention on follow-up. 3.  Post surgical and radiation change in the right upper lobe.  CT ABDOMEN AND PELVIS  Findings:  There are multiple hypodense lesions within the liver consistent metastasis.  Approximate eight lesions within the left and right hepatic lobes combined.  Large lesion in the central left  hepatic lobe measures 3.8 x 2.7 cm (image 61, series 2).  Lesion in the superior right hepatic lobe measures 18 mm (image 52).  Lesion in the inferior right hepatic lobe measures 22 mm.  The gallbladder, pancreas, spleen, kidneys are normal. There is thickening of the adrenal gland which is nonspecific.  The stomach, small bowel, colon unremarkable.  Abdominal aorta normal caliber.  No retroperitoneal periportal lymphadenopathy.  No mesenteric peritoneal disease.  No free fluid the pelvis.  The prostate gland is enlarged.  Bladder is normal.  Review of the bone windows demonstrates multiple mixed lytic and sclerotic lesions throughout the thoracic lumbar spine and pelvis. Compression fractures in the lumbar spine at L1 and L2 are not significantly changed from comparison MRI.  IMPRESSION:  1.  Multiple hepatic metastases within the left and right hepatic lobes. 2.  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 3.  Thickening of the left adrenal gland is nonspecific.   Original Report Authenticated By: Genevive Bi, M.D.   Mr Lumbar Spine Wo Contrast  12/13/2012   *RADIOLOGY REPORT*  Clinical Data: Chronic low back pain.  Bilateral extension to the legs.  Pain and weakness.  MRI LUMBAR SPINE WITHOUT CONTRAST  Technique:  Multiplanar and multiecho pulse sequences of the lumbar spine were obtained without intravenous contrast.  Comparison: Radiographs 12/08/2012.  Findings: The numbering convention used for this exam terms L5-S1 as the last full intervertebral disc space above the sacrum.  The marrow signal is diffusely abnormal, most compatible with infiltrative process such as myeloma or metastatic disease. Osseous lymphoma merits consideration as well.  Compression fractures of L1 and L2 are present.  The L1 compression fracture shows 50% loss of anterior vertebral body height without retropulsion. L2 shows about 70% loss of anterior vertebral body height with mild retropulsion.  There is resulting  severe central stenosis at L1-L2.  The spinal cord terminates posterior to L1-L2 interspace with the retropulsed L2 superior vertebra contacting the tip of the conus medullaris.  Allowing for technical differences, there appears to be little if any change compared to the recent radiographs of 12/08/2012.   The marrow process extends into the posterior elements, again compatible with neoplasm rather than a benign process. There appear to be fractures of the T12 and L1 spinous processes on the inversion recovery images. Cystic lesion is present in the inferior right kidney, probably a renal cyst.  There is mild levoconvex curve of the lumbar spine with the apex at L3.  T10-T11:  Partially visualized.  There is some distortion at the edge of the margin of the scan however  there may be the posterior expansion of the T10 vertebra encroaching on the central canal.  A thoracic spine MRI should be considered.  T11-T12:  Disc desiccation.  No stenosis.  T12-L1:  Disc desiccation.  Mild expansion of the pedicles associated with infiltrative marrow process with mild right foraminal encroachment.  L1-L2:  Severe central stenosis with complete effacement of the subarachnoid space.  This is predominately due to retropulsion with some contribution from posterior element expansion.  L2-L3:  Mild central stenosis associated with left eccentric broad- based disc bulging.  There is moderate left and mild right foraminal stenosis associated with short pedicles and bulging disc.  L3-L4:  Disc desiccation and loss of height.  Left foraminal and lateral protrusion.  Mild symmetric bilateral foraminal stenosis without neural compression. Moderate central stenosis associated with broad-based disc bulging and short pedicles.  L4-L5:  Severe disc degeneration.  There is some clumping of nerve roots this level, however this is probably secondary to the central stenosis at levels above.  There is moderate bilateral foraminal stenosis and mild  to moderate central stenosis.  Encroachment on both lateral recesses potentially affecting the descending L5 nerves.  L5-S1:  Severe disc degeneration.  Central canal patent.  Bilateral left greater than right lateral recess encroachment associated with broad-based disc bulging and endplate osteophyte.  Moderate left and mild right foraminal stenosis.  IMPRESSION: 1.  Diffuse marrow infiltration compatible with neoplasm. Metastatic disease, myeloma, or lymphoma are primary considerations. 2.  L1 and L2 compression fractures are likely pathologic based on the infiltrative marrow process.  Greater loss of vertebral body height at L2 than at L1 with retropulsion and resulting severe central stenosis.  No cord edema.  Probable T12 and L1 spinous process fractures. 3.  Multilevel degenerative disease superimposed, detailed above.   Original Report Authenticated By: Andreas Newport, M.D.   Mr Thoracic Spine W Wo Contrast  12/29/2012   CLINICAL DATA:  67 year old male with metastatic lung cancer. Pathologic compression fractures in the lumbar spine with spinal stenosis. Severe back pain. New onset of lower extremity numbness and difficulty walking.  EXAM: MRI THORACIC SPINE WITHOUT AND WITH CONTRAST  TECHNIQUE: Multiplanar and multiecho pulse sequences of the thoracic spine were obtained without and with intravenous contrast.  CONTRAST:  12mL MULTIHANCE GADOBENATE DIMEGLUMINE 529 MG/ML IV SOLN  COMPARISON:  Lumbar MRI 12/13/2012.  Chest CT 12/20/2012.  FINDINGS: Thoracic numbering agrees with the 12/13/2012 lumbar study. Limited sagittal imaging of the cervical spine suggests mild if any cervical osseous metastatic disease.  Advanced thoracic osseous metastases. Only of the T1 and T8 vertebral bodies appear to remain spared at this time. Multilevel posterior rib metastases. Multilevel thoracic posterior element metastases. There is a moderate pathologic compression fracture of T10 with diffuse vertebral involvement by  tumor at that level and confluent medial right 10th rib involvement. There is mild epidural extension of tumor and mild retropulsion of the posterior vertebral body resulting in mild spinal stenosis at this level. No associated spinal cord signal abnormality.  In the upper lumbar spine epidural tumor is severe at L2, and today's post-contrast images demonstrate associated confluent abnormal dural thickening and enhancement tracking cephalad from the lumbar spine to the posterior T10 level. Subsequent moderate to severe spinal stenosis at the superior L2 level which appears to correspond to the conus medullaris. Still, no definite conus or lower thoracic spinal cord signal abnormality.  Normal thoracic spine thecal sac patency above the T10 level. Mild ventral epidural tumor at T11 without significant  spinal stenosis. Mild circumferential epidural tumor at T12 with borderline to mild spinal stenosis. No abnormal intradural enhancement identified.  Abnormal lung parenchyma, appears stable since 12/20/12. Trace partially loculated right pleural effusion.  IMPRESSION: 1. 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 Today's post-contrast images demonstrate extension of dural tumor into the lower thoracic spine as far as the T10 level.  2. Widespread thoracic metastatic disease. T10 moderate pathologic compression fracture. 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  Study discussed by telephone with Dr. Welton Flakes on 12/29/2012 at 1508 hr.   Electronically Signed   By: Augusto Gamble   On: 12/29/2012 15:10   Ct Abdomen Pelvis W Contrast  12/20/2012   *RADIOLOGY REPORT*  Clinical Data:  Follow-up lung cancer.  Chemotherapy.  bone metastasis.  CT CHEST, ABDOMEN AND PELVIS WITH CONTRAST  Technique:  Multidetector CT imaging of the chest, abdomen and  pelvis was performed following the standard protocol during bolus administration of intravenous contrast.  Contrast: OMNIPAQUE IOHEXOL 300 MG/ML  SOLN  Comparison:  MRI lumbar spine 12/13/2012  CT CHEST  Findings:  No axillary or supraclavicular lymphadenopathy.  There is several small lesions within the thyroid gland all less than 10 mm.  No mediastinal lymphadenopathy.  Postsurgical change in the right hilum.  Esophagus is normal.  Review of the lung parenchyma demonstrates linear scarring in the right upper lobe.  There is a nodule within the right upper lobe measuring 5 mm (image 17).  Adjacent smaller 3 mm nodule (image 20).  In the right lower lobe there is a posterior medial 6 mm nodule (image 23) on the right.  There is ill-defined ground-glass nodule at the right lower lobe a (image 26).  In the right middle lobe there is a 7 mm pulmonary nodule (image 33).  Focus of ground- glass nodularity in the left upper lobe measuring 1.7 cm (image #18).  IMPRESSION:  1.  Rounded sub centimeter nodules in the right lower lobe, right middle lobe, and right upper lobe are concerning for metastasis. 2.  Ground-glass nodule in the right lower lobe and left upper lobe are indeterminate. Recommend attention on follow-up. 3.  Post surgical and radiation change in the right upper lobe.  CT ABDOMEN AND PELVIS  Findings:  There are multiple hypodense lesions within the liver consistent metastasis.  Approximate eight lesions within the left and right hepatic lobes combined.  Large lesion in the central left hepatic lobe measures 3.8 x 2.7 cm (image 61, series 2).  Lesion in the superior right hepatic lobe measures 18 mm (image 52).  Lesion in the inferior right hepatic lobe measures 22 mm.  The gallbladder, pancreas, spleen, kidneys are normal. There is thickening of the adrenal gland which is nonspecific.  The stomach, small bowel, colon unremarkable.  Abdominal aorta normal caliber.  No retroperitoneal periportal  lymphadenopathy.  No mesenteric peritoneal disease.  No free fluid the pelvis.  The prostate gland is enlarged.  Bladder is normal.  Review of the bone windows demonstrates multiple mixed lytic and sclerotic lesions throughout the thoracic lumbar spine and pelvis. Compression fractures in the lumbar spine at L1 and L2 are not significantly changed from comparison MRI.  IMPRESSION:  1.  Multiple hepatic metastases within the left and right hepatic lobes. 2.  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 3.  Thickening of the left adrenal gland is nonspecific.   Original Report Authenticated By: Genevive Bi, M.D.    ASSESSMENT AND PLAN: This is a very pleasant 67 years old African American male with metastatic non-small cell lung cancer, adenocarcinoma with negative EGFR mutation, negative ALK gene translocation and negative KRAS. He is status post several chemotherapy regimens including concurrent chemoradiation with cisplatin and etoposide, systemic chemotherapy with carboplatin, paclitaxel and Avastin followed by 2 cycles of maintenance Avastin as well as 2 cycles of second line chemotherapy with single agent Alimta. The patient has evidence for disease progression after his last treatment in May of 2014. His recent CT scan of the chest, abdomen and pelvis as well as MRI of the thoracolumbar spine showed significant evidence for disease progression especially in the liver as well as the bone disease with questionable epidural and dural tumor in the upper lumbar spines.  I discussed the scan results and showed the images to the patient today. I recommended for him to complete the course of palliative radiotherapy to the lower thoracic and upper lumbar spines for pain management. He'll also continue on Percocet, ibuprofen and Decadron. I would see him back for followup visit in 2 weeks for reevaluation and more detailed discussion of his systemic treatment options. He was advised to call immediately  if she has any concerning symptoms in the interval.  The patient voices understanding of current disease status and treatment options and is in agreement with the current care plan.  All questions were answered. The patient knows to call the clinic with any problems, questions or concerns. We can certainly see the patient much sooner if necessary.  I spent 15 minutes counseling the patient face to face. The total time spent in the appointment was 25 minutes.

## 2013-01-05 ENCOUNTER — Ambulatory Visit
Admission: RE | Admit: 2013-01-05 | Discharge: 2013-01-05 | Disposition: A | Discharge status: Home / Self Care | Payer: Medicare Other | Source: Ambulatory Visit | Attending: Radiation Oncology | Admitting: Radiation Oncology

## 2013-01-06 ENCOUNTER — Ambulatory Visit: Payer: Self-pay

## 2013-01-06 ENCOUNTER — Ambulatory Visit: Payer: Medicare Other

## 2013-01-09 ENCOUNTER — Ambulatory Visit
Admission: RE | Admit: 2013-01-09 | Discharge: 2013-01-09 | Disposition: A | Discharge status: Home / Self Care | Payer: Medicare Other | Source: Ambulatory Visit | Attending: Radiation Oncology | Admitting: Radiation Oncology

## 2013-01-09 ENCOUNTER — Ambulatory Visit: Payer: Medicare Other

## 2013-01-10 ENCOUNTER — Encounter: Payer: Self-pay | Admitting: Radiation Oncology

## 2013-01-10 ENCOUNTER — Ambulatory Visit
Admission: RE | Admit: 2013-01-10 | Discharge: 2013-01-10 | Disposition: A | Discharge status: Home / Self Care | Payer: Medicare Other | Source: Ambulatory Visit | Attending: Radiation Oncology | Admitting: Radiation Oncology

## 2013-01-10 ENCOUNTER — Ambulatory Visit: Payer: Medicare Other

## 2013-01-10 NOTE — Progress Notes (Signed)
Weekly rad txs low T-L spine, 2/10 completed, pain 4-5 on 1-10 scale, "in my hip joints", it gets better after I start walking in the morning, not constipated"I opened up" taking colace, ran out of decadron 1-2 days ago ;, and taking pain med from the [place], , appetite fair, drinking mostly water and ensure 2 x day, prune juice, no c/o nausea, c/o heartburn  1:27 PM  1:27 PM

## 2013-01-10 NOTE — Progress Notes (Signed)
[facility]    Radiation Oncology [address] [place]     Maryln Gottron, M.D. [place], [place] [address]               [name], M.D., Ph.D. Phone: [phone]      Molli Hazard A. Kathrynn Running, M.D. Fax: [phone]      Radene Gunning, M.D., Ph.D.         Lurline Hare, M.D.         Grayland Jack, M.D Weekly Treatment Management Note  Name: Billy Collier     MRN: [id number]        CSN: [id number] Date: 01/10/2013      DOB: 09/28/1945  CC: Provider Not In System         No ref. provider found    Status: Outpatient  Diagnosis: The encounter diagnosis was Lung cancer, right.  Current Dose: 15 Gy  Current Fraction: 6  Planned Dose: 35 Gy  Narrative: Billy Collier was seen today for weekly treatment management. The chart was checked and port films  were reviewed. He is tolerating his treatments well. He denies any nausea. He is actually had improvement in his constipation issues since starting radiation therapy. His pain also has improved.  I did review the patient's recent chest CT scan with a diagnostic radiologist.  Radiation changes are noted only in the right upper lung field with treatment changes not extending below T7. The patient's current radiation field well below this area and therefore no overlap with his previous lung cancer treatment.  The patient is ambulating in for his radiation therapy.  Review of patient's allergies indicates no known allergies. Current Outpatient Prescriptions  Medication Sig Dispense Refill  . cyclobenzaprine (FLEXERIL) 5 MG tablet Take 1 tablet (5 mg total) by mouth 3 (three) times daily as needed for muscle spasms.  30 tablet  0  . diazepam (VALIUM) 5 MG tablet Take 1 tablet (5 mg total) by mouth every 8 (eight) hours as needed (muscle spasm).  15 tablet  0  . ENSURE (ENSURE) Take 237 mLs by mouth continuous dialysis.      Marland Kitchen lisinopril (PRINIVIL,ZESTRIL) 10 MG tablet Take 25 mg by mouth daily.      . magnesium oxide (MAG-OX) 400  MG tablet Take 400 mg by mouth 2 (two) times daily.      Marland Kitchen oxyCODONE-acetaminophen (PERCOCET/ROXICET) 5-325 MG per tablet Take 1-2 tablets by mouth every 4 (four) hours as needed for pain.  90 tablet  0  . dexamethasone (DECADRON) 4 MG tablet Take 2 tablets (8 mg total) by mouth every 8 (eight) hours.  90 tablet  0  . ibuprofen (ADVIL,MOTRIN) 600 MG tablet Take 1 tablet (600 mg total) by mouth every 6 (six) hours as needed for pain.  30 tablet  0  . Simethicone (GAS-X PO) Take 1 tablet by mouth daily as needed (gas).       No current facility-administered medications for this encounter.   Labs:  Lab Results  Component Value Date   WBC 8.0 12/29/2012   HGB 11.9* 12/29/2012   HCT 33.1* 12/29/2012   MCV 85.8 12/29/2012   PLT 115* 12/29/2012   Lab Results  Component Value Date   CREATININE 0.8 12/29/2012   BUN 8.5 12/29/2012   NA 128* 12/29/2012   K 3.8 12/29/2012   CO2 26 12/29/2012   Lab Results  Component Value Date   ALT 27 12/29/2012   AST 36* 12/29/2012   BILITOT  0.59 12/29/2012    Physical Examination:  weight is 124 lb 9.6 oz (56.518 kg). His oral temperature is 97.5 F (36.4 C). His blood pressure is 120/87 and his pulse is 115. His respiration is 20 and oxygen saturation is 98%.    Wt Readings from Last 3 Encounters:  01/10/13 124 lb 9.6 oz (56.518 kg)  01/04/13 129 lb 12.8 oz (58.877 kg)  01/04/13 129 lb 9.6 oz (58.786 kg)     Lungs - Normal respiratory effort, chest expands symmetrically. Lungs are clear to auscultation, no crackles or wheezes.  Heart has regular rhythm and rate  Abdomen is soft and non tender with normal bowel sounds  Assessment:  Patient tolerating treatments well  Plan: Continue treatment per original radiation prescription

## 2013-01-11 ENCOUNTER — Ambulatory Visit: Payer: Medicare Other

## 2013-01-11 ENCOUNTER — Ambulatory Visit
Admission: RE | Admit: 2013-01-11 | Discharge: 2013-01-11 | Disposition: A | Discharge status: Home / Self Care | Payer: Medicare Other | Source: Ambulatory Visit | Attending: Radiation Oncology | Admitting: Radiation Oncology

## 2013-01-11 DIAGNOSIS — C3490 Malignant neoplasm of unspecified part of unspecified bronchus or lung: Secondary | ICD-10-CM

## 2013-01-11 MED ORDER — CYCLOBENZAPRINE HCL 5 MG PO TABS: 5.0000 mg | ORAL_TABLET | Freq: Three times a day (TID) | ORAL | Status: AC | PRN

## 2013-01-11 MED ORDER — OXYCODONE-ACETAMINOPHEN 5-325 MG PO TABS: 1.0000 | ORAL_TABLET | ORAL | Status: DC | PRN

## 2013-01-12 ENCOUNTER — Ambulatory Visit: Payer: Self-pay

## 2013-01-12 ENCOUNTER — Ambulatory Visit: Payer: Medicare Other

## 2013-01-13 ENCOUNTER — Ambulatory Visit
Admission: RE | Admit: 2013-01-13 | Discharge: 2013-01-13 | Disposition: A | Discharge status: Home / Self Care | Payer: Medicare Other | Source: Ambulatory Visit | Attending: Radiation Oncology | Admitting: Radiation Oncology

## 2013-01-13 ENCOUNTER — Ambulatory Visit: Payer: Medicare Other

## 2013-01-16 ENCOUNTER — Ambulatory Visit: Payer: Medicare Other

## 2013-01-16 ENCOUNTER — Ambulatory Visit: Payer: Self-pay

## 2013-01-17 ENCOUNTER — Ambulatory Visit: Payer: Medicare Other

## 2013-01-17 ENCOUNTER — Ambulatory Visit
Admission: RE | Admit: 2013-01-17 | Discharge: 2013-01-17 | Disposition: A | Discharge status: Home / Self Care | Payer: Medicare Other | Source: Ambulatory Visit | Attending: Radiation Oncology | Admitting: Radiation Oncology

## 2013-01-17 ENCOUNTER — Encounter: Payer: Self-pay | Admitting: Radiation Oncology

## 2013-01-17 LAB — COMPREHENSIVE METABOLIC PANEL (CC13)
AST: 25 U/L (ref 5–34)
Albumin: 3.1 g/dL — ABNORMAL LOW (ref 3.5–5.0)
BUN: 37 mg/dL — ABNORMAL HIGH (ref 7.0–26.0)
CO2: 28 mEq/L (ref 22–29)
Calcium: 9 mg/dL (ref 8.4–10.4)
Chloride: 82 mEq/L — ABNORMAL LOW (ref 98–109)
Glucose: 118 mg/dl (ref 70–140)
Potassium: 4.7 mEq/L (ref 3.5–5.1)

## 2013-01-17 LAB — CBC WITH DIFFERENTIAL/PLATELET
Basophils Absolute: 0.1 10*3/uL (ref 0.0–0.1)
Eosinophils Absolute: 0 10*3/uL (ref 0.0–0.5)
HGB: 12.3 g/dL — ABNORMAL LOW (ref 13.0–17.1)
MONO#: 0.4 10*3/uL (ref 0.1–0.9)
NEUT#: 15.4 10*3/uL — ABNORMAL HIGH (ref 1.5–6.5)
RDW: 18 % — ABNORMAL HIGH (ref 11.0–14.6)
lymph#: 0.2 10*3/uL — ABNORMAL LOW (ref 0.9–3.3)

## 2013-01-17 MED ORDER — SODIUM CHLORIDE 0.9 % IV SOLN
Freq: Once | INTRAVENOUS | Status: AC
  Administered 2013-01-17: 15:00:00 via INTRAVENOUS

## 2013-01-17 MED ORDER — OXYCODONE-ACETAMINOPHEN 5-325 MG PO TABS
ORAL_TABLET | ORAL | Status: AC
  Filled 2013-01-17: qty 1

## 2013-01-17 MED ORDER — OXYCODONE-ACETAMINOPHEN 5-325 MG PO TABS
2.0000 | ORAL_TABLET | ORAL | Status: AC
  Administered 2013-01-17: 2 via ORAL

## 2013-01-17 MED ORDER — OXYCODONE-ACETAMINOPHEN 5-325 MG PO TABS
ORAL_TABLET | ORAL | Status: AC
  Filled 2013-01-17: qty 2

## 2013-01-17 MED ORDER — PROCHLORPERAZINE MALEATE 10 MG PO TABS: 10.0000 mg | ORAL_TABLET | Freq: Four times a day (QID) | ORAL | Status: DC | PRN

## 2013-01-17 MED ORDER — MAGIC MOUTHWASH W/LIDOCAINE: 15.0000 mL | Freq: Three times a day (TID) | ORAL | Status: DC | PRN

## 2013-01-17 MED ORDER — SUCRALFATE 1 GM/10ML PO SUSP: 1.0000 g | Freq: Four times a day (QID) | ORAL | Status: DC

## 2013-01-17 NOTE — Progress Notes (Addendum)
Weekly Management Note Current Dose: 12.5 Gy  Projected Dose: 25 Gy   Narrative:  The patient presents for routine under treatment assessment.  CBCT/MVCT images/Port film x-rays were reviewed.  The chart was checked. Patient asked to be seen pre RT for nausea and decreased appetite. Fell in lobby. States he has difficulty with solid food. No difficulty with water or liquids.  Drinking 2 boost per day. No appetite. Constipation has resolved. Leg weakness is stable.   Physical Findings:  Cachetic. No oral thrush. Alert and oriented x 3.   Vitals:  Filed Vitals:   01/17/13 1217  BP: 86/55  Pulse: 112  Temp: 97.7 F (36.5 C)   Weight:  Wt Readings from Last 3 Encounters:  01/17/13 120 lb 12.8 oz (54.795 kg)  01/10/13 124 lb 9.6 oz (56.518 kg)  01/04/13 129 lb 12.8 oz (58.877 kg)   Lab Results  Component Value Date   WBC 8.0 12/29/2012   HGB 11.9* 12/29/2012   HCT 33.1* 12/29/2012   MCV 85.8 12/29/2012   PLT 115* 12/29/2012   Lab Results  Component Value Date   CREATININE 0.8 12/29/2012   BUN 8.5 12/29/2012   NA 128* 12/29/2012   K 3.8 12/29/2012   CO2 26 12/29/2012     Impression:  The patient is tolerating radiation.  Plan:  Continue treatment as planned. Gave script for MMW with lidocaine, carafate and compazine. Refer to dietician. Encouraged PO. Labs today. Schedule IVF for tomorrow. 1 liter NS.

## 2013-01-17 NOTE — Patient Instructions (Signed)
Dehydration, Elderly  Dehydration is when you lose more fluids from the body than you take in. Vital organs such as the kidneys, brain, and heart cannot function without a proper amount of fluids and salt. Any loss of fluids from the body can cause dehydration.   Older adults are at a higher risk of dehydration than younger adults. As we age, our bodies are less able to conserve water and do not respond to temperature changes as well. Also, older adults do not become thirsty as easily or quickly. Because of this, older adults often do not realize they need to increase fluids to avoid dehydration.   CAUSES    Vomiting.   Diarrhea.   Excessive sweating.   Excessive urine output.   Fever.  SYMPTOMS   Mild dehydration   Thirst.   Dry lips.   Slightly dry mouth.  Moderate dehydration   Very dry mouth.   Sunken eyes.   Skin does not bounce back quickly when lightly pinched and released.   Dark urine and decreased urine production.   Decreased tear production.   Headache.  Severe dehydration   Very dry mouth.   Extreme thirst.   Rapid, weak pulse (more than 100 beats per minute at rest).   Cold hands and feet.   Not able to sweat in spite of heat.   Rapid breathing.   Blue lips.   Confusion and lethargy.   Difficulty being awakened.   Minimal urine production.   No tears.  DIAGNOSIS   Your caregiver will diagnose dehydration based on your symptoms and your exam. Blood and urine tests will help confirm the diagnosis. The diagnostic evaluation should also identify the cause of dehydration.  TREATMENT   Treatment of mild or moderate dehydration can often be done at home by increasing the amount of fluids that you drink. It is best to drink small amounts of fluid more often. Drinking too much at one time can make vomiting worse.   Severe dehydration needs to be treated at the hospital where you will probably be given intravenous (IV) fluids that contain water and electrolytes.  HOME CARE INSTRUCTIONS     Ask your caregiver about specific rehydration instructions.   Drink enough fluids to keep your urine clear or pale yellow.   Drink small amounts frequently if you have nausea and vomiting.   Eat as you normally do.   Avoid:   Foods or drinks high in sugar.   Carbonated drinks.   Juice.   Extremely hot or cold fluids.   Drinks with caffeine.   Fatty, greasy foods.   Alcohol.   Tobacco.   Overeating.   Gelatin desserts.   Wash your hands well to avoid spreading bacteria and viruses.   Only take over-the-counter or prescription medicines for pain, discomfort, or fever as directed by your caregiver.   Ask your caregiver if you should continue all prescribed and over-the-counter medicines.   Keep all follow-up appointments with your caregiver.  SEEK MEDICAL CARE IF:   You have abdominal pain and it increases or stays in one area (localizes).   You have a rash, stiff neck, or severe headache.   You are irritable, sleepy, or difficult to awaken.   You are weak, dizzy, or extremely thirsty.  SEEK IMMEDIATE MEDICAL CARE IF:    You are unable to keep fluids down, or you get worse despite treatment.   You have frequent episodes of vomiting or diarrhea.   You have blood or green   matter (bile) in your vomit.   You have blood in your stool or your stool looks black and tarry.   You have not urinated in 6 to 8 hours, or you have only urinated a small amount of very dark urine.   You have a fever.   You faint.  MAKE SURE YOU:    Understand these instructions.   Will watch your condition.   Will get help right away if you are not doing well or get worse.  Document Released: 07/11/2003 Document Revised: 07/13/2011 Document Reviewed: 12/08/2010  Exit[employer] Patient Information 2014 [employer].

## 2013-01-17 NOTE — Progress Notes (Addendum)
Billy Collier has received 4 fractions to his LowT - Lspine.  He is traveling by wheelchair because he reports that he experienced dizziness and "slid down to the floor" in the [facility] lobby area.  He was assisted to a Wheelchair by the greeter.  He does not have any injuries today.  He reports that he has hardly eaten in the past 3 days.  He has had 1-2 ensures daily during this time frame.  Hypotension noted with an elevated pulse, but did not stand for safety reasons.  He c/o nausea whenever he eats. He does not have any anti-emetics.  Questioned Wilma Corpening, greeter, and she states she did not witness the "fall", but was informed by a visitor in the lobby area, that Billy Collier needed a wheelchair.  She observed patient kneeling in the floor, "on one knee" prior to being assisted into a wheelchair.  He was immediately wheeled own to the Radiation Oncology Nursing area.  He denies any injuries and none noted after assessment.    He was examined by Dr. Lurline Hare and because of his  Hypotension, He received IV fluid hydration with 0.9 NS in Medical Oncology infusion area after having labs drawn for a CBC and a CMET (Na level 119).  Disposition to home after fluids with Billy Collier.  Prior to infusion, Billy Collier and his family member, Billy Collier, were instructed, that for the future, to drive up to the cancer center daily so that Billy Collier can be assisted into a wheelchair from the car prior to entering the Cancer Center to prevent any injuries.  Will assess in the tomorrow

## 2013-01-18 ENCOUNTER — Ambulatory Visit
Admission: RE | Admit: 2013-01-18 | Discharge: 2013-01-18 | Disposition: A | Discharge status: Home / Self Care | Payer: Non-veteran care | Source: Ambulatory Visit | Attending: Radiation Oncology | Admitting: Radiation Oncology

## 2013-01-18 ENCOUNTER — Other Ambulatory Visit: Payer: Self-pay | Admitting: Radiation Oncology

## 2013-01-18 ENCOUNTER — Ambulatory Visit: Payer: Medicare Other

## 2013-01-18 NOTE — Progress Notes (Addendum)
Called Mr. Zuccaro's sister, Maralyn Sago, called with a request to please bring all of Mr. Sylvester's mediations for review and to expressly make sure his Decadron is included.  She stated she was familiar with this medication and would ensure it is included.  Expressed thanks.

## 2013-01-18 NOTE — Progress Notes (Addendum)
Reviewed Billy Collier, that he brought to the clinic, but several were missing, therefore, medication list was copied and missing medications highlighted.  He and his sister will check to see if these medications are current, and will bring the list back on tomorrow.  Called in Decadron taper to [facility] in [place], [place].  See Dr. Luciano Cutter taper.  Replaced original script of 8 mg  Q 8 hours.  Starting today- 01/18/13   8 mg in AM, 8 mg at lunch, 4 mg at dinner for 7 days.  8 mg in AM, 8 mg at lunch for 7 days  8 mg in AM, 4 mg at dinner for 7 days   4 mg in AM, 4 mg at dinner for 7 days  4 mg in AM for 7 days   2 mg in AM for 7 days.

## 2013-01-19 ENCOUNTER — Ambulatory Visit
Admission: RE | Admit: 2013-01-19 | Discharge: 2013-01-19 | Disposition: A | Discharge status: Home / Self Care | Payer: Medicare Other | Source: Ambulatory Visit | Attending: Radiation Oncology | Admitting: Radiation Oncology

## 2013-01-19 ENCOUNTER — Ambulatory Visit: Payer: Medicare Other

## 2013-01-19 ENCOUNTER — Telehealth: Payer: Self-pay | Admitting: Oncology

## 2013-01-19 LAB — BASIC METABOLIC PANEL (CC13)
BUN: 23.2 mg/dL (ref 7.0–26.0)
Chloride: 86 mEq/L — ABNORMAL LOW (ref 98–109)
Creatinine: 0.7 mg/dL (ref 0.7–1.3)

## 2013-01-19 NOTE — Telephone Encounter (Signed)
Called and talked to [employer].  He said he is feeling OK and watching the news.  He denies any dizziness and does not feel light headed.  Advised him to drink ensure and to drink Gatorade.  He said that he would and that he would get some Gatorade.  He said that he would call if he needed anything.

## 2013-01-20 ENCOUNTER — Ambulatory Visit
Admission: RE | Admit: 2013-01-20 | Discharge: 2013-01-20 | Disposition: A | Discharge status: Home / Self Care | Payer: Medicare Other | Source: Ambulatory Visit | Attending: Radiation Oncology | Admitting: Radiation Oncology

## 2013-01-23 ENCOUNTER — Ambulatory Visit (HOSPITAL_BASED_OUTPATIENT_CLINIC_OR_DEPARTMENT_OTHER): Payer: Medicare Other | Admitting: Internal Medicine

## 2013-01-23 ENCOUNTER — Encounter: Payer: Self-pay | Admitting: Internal Medicine

## 2013-01-23 ENCOUNTER — Other Ambulatory Visit (HOSPITAL_BASED_OUTPATIENT_CLINIC_OR_DEPARTMENT_OTHER): Payer: Non-veteran care | Admitting: Lab

## 2013-01-23 ENCOUNTER — Ambulatory Visit
Admission: RE | Admit: 2013-01-23 | Discharge: 2013-01-23 | Disposition: A | Discharge status: Home / Self Care | Payer: Non-veteran care | Source: Ambulatory Visit | Attending: Radiation Oncology | Admitting: Radiation Oncology

## 2013-01-23 DIAGNOSIS — C3490 Malignant neoplasm of unspecified part of unspecified bronchus or lung: Secondary | ICD-10-CM

## 2013-01-23 LAB — COMPREHENSIVE METABOLIC PANEL (CC13)
Albumin: 2.7 g/dL — ABNORMAL LOW (ref 3.5–5.0)
Alkaline Phosphatase: 224 U/L — ABNORMAL HIGH (ref 40–150)
BUN: 26.9 mg/dL — ABNORMAL HIGH (ref 7.0–26.0)
CO2: 26 mEq/L (ref 22–29)
Calcium: 8.7 mg/dL (ref 8.4–10.4)
Chloride: 86 mEq/L — ABNORMAL LOW (ref 98–109)
Glucose: 125 mg/dl (ref 70–140)
Potassium: 4.2 mEq/L (ref 3.5–5.1)
Sodium: 122 mEq/L — ABNORMAL LOW (ref 136–145)
Total Protein: 5.6 g/dL — ABNORMAL LOW (ref 6.4–8.3)

## 2013-01-23 LAB — CBC WITH DIFFERENTIAL/PLATELET
Basophils Absolute: 0 10*3/uL (ref 0.0–0.1)
Eosinophils Absolute: 0 10*3/uL (ref 0.0–0.5)
HGB: 11.1 g/dL — ABNORMAL LOW (ref 13.0–17.1)
MCV: 91.3 fL (ref 79.3–98.0)
MONO#: 0.5 10*3/uL (ref 0.1–0.9)
NEUT#: 8.9 10*3/uL — ABNORMAL HIGH (ref 1.5–6.5)
RBC: 3.57 10*6/uL — ABNORMAL LOW (ref 4.20–5.82)
RDW: 18.7 % — ABNORMAL HIGH (ref 11.0–14.6)
WBC: 9.4 10*3/uL (ref 4.0–10.3)
lymph#: 0.1 10*3/uL — ABNORMAL LOW (ref 0.9–3.3)

## 2013-01-23 MED ORDER — ERLOTINIB HCL 150 MG PO TABS: 150.0000 mg | ORAL_TABLET | Freq: Every day | ORAL | Status: AC

## 2013-01-23 NOTE — Progress Notes (Signed)
[facility] [phone]   Fax:([phone]) [phone]  OFFICE PROGRESS NOTE  Provider Not In System No address on file  DIAGNOSIS: Metastatic non-small cell lung cancer, adenocarcinoma with negative EGFR mutation and negative ALK gene translocation as well as negative KRAS initially diagnosed as stage IIIB (T3, N3, M0) in June of 2012   PRIOR THERAPY:( done at the [facility], in [place], [place] ).  1) status post concurrent chemoradiation with cisplatin and etoposide for 2 cycles started November of 2012 and completed January of 2013.  2) status post 4 cycles of systemic chemotherapy was carboplatin, paclitaxel and Avastin for metastatic disease completed in 05/06/2012 with disease progression.  3) status post 2 cycles of maintenance Avastin 15 mg/kg, last dose was given 07/13/2012 discontinued secondary to disease progression.  4) status post 2 cycles of systemic chemotherapy with single agent Alimta 500 mg/M2 last dose was given in May of 2014 discontinued secondary to disease progression. 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 Roselind Messier. Last fraction of radiation on 01/24/2013.  CURRENT THERAPY: Tarceva 150 mg by mouth daily. First dose expected in the next few days.  CHEMOTHERAPY INTENT: Palliative  CURRENT # OF CHEMOTHERAPY CYCLES: 1  CURRENT ANTIEMETICS: Compazine  CURRENT SMOKING STATUS: Former smoker  ORAL CHEMOTHERAPY AND CONSENT: Tarceva and consent was signed 01/23/2013  CURRENT BISPHOSPHONATES USE: None  PAIN MANAGEMENT: 1/10 lower back  NARCOTICS INDUCED CONSTIPATION: None  LIVING WILL AND CODE STATUS: ?    INTERVAL HISTORY: Billy Collier 67 y.o. male returns to the clinic today for followup visit accompanied by his sister. The patient is feeling fine today with no specific complaints except for lack of appetite  and weight loss. He tolerated his palliative radiotherapy to the lower thoracic and upper lumbar spines fairly well with much improvement in the pain in this area. He denied having any significant chest pain, shortness of breath, cough or hemoptysis. The patient denied having any nausea or vomiting. He is here today for evaluation and discussion of his treatment options.  MEDICAL HISTORY: Past Medical History  Diagnosis Date  . Hypertension   . PTSD (post-traumatic stress disorder)   . Chronic back pain   . Cancer   . Lung cancer     ALLERGIES:  has No Known Allergies.  MEDICATIONS:  Current Outpatient Prescriptions  Medication Sig Dispense Refill  . Alum & Mag Hydroxide-Simeth (MAGIC MOUTHWASH W/LIDOCAINE) SOLN Take 15 mLs by mouth 3 (three) times daily as needed (before meals as needed).  480 mL  2  . anti-nausea (EMETROL) solution Take 10 mLs by mouth every 15 (fifteen) minutes as needed for nausea.      . cyclobenzaprine (FLEXERIL) 5 MG tablet Take 1 tablet (5 mg total) by mouth 3 (three) times daily as needed for muscle spasms.  30 tablet  0  . dexamethasone (DECADRON) 4 MG tablet Take 4 mg by mouth as directed. Decadron Taper Starting today - 01/18/13  8 mg in AM, 8 mg at lunch, 4 mg at dinner for 7 days.  8 mg in AM, 8 mg at dinner for 7 days 8 mg in AM, 4 mg at dinner for 7 days 4 mg in AM, 4 mg at dinner for 7 days 4 mg in AM for 7 days 2 mg in AM for 7 days. Then Stop      .  diazepam (VALIUM) 5 MG tablet Take 1 tablet (5 mg total) by mouth every 8 (eight) hours as needed (muscle spasm).  15 tablet  0  . ENSURE (ENSURE) Take 237 mLs by mouth continuous dialysis.      Marland Kitchen ibuprofen (ADVIL,MOTRIN) 600 MG tablet Take 1 tablet (600 mg total) by mouth every 6 (six) hours as needed for pain.  30 tablet  0  . lisinopril-hydrochlorothiazide (PRINZIDE,ZESTORETIC) 10-12.5 MG per tablet Take 1 tablet by mouth daily.      . magnesium oxide (MAG-OX) 400 MG tablet Take 400 mg by mouth 2 (two)  times daily.      Marland Kitchen oxyCODONE-acetaminophen (PERCOCET/ROXICET) 5-325 MG per tablet Take 1-2 tablets by mouth every 4 (four) hours as needed for pain.  90 tablet  0  . prochlorperazine (COMPAZINE) 10 MG tablet Take 1 tablet (10 mg total) by mouth every 6 (six) hours as needed.  30 tablet  1  . Simethicone (GAS-X PO) Take 1 tablet by mouth daily as needed (gas).      . sucralfate (CARAFATE) 1 GM/10ML suspension Take 10 mLs (1 g total) by mouth 4 (four) times daily.  420 mL  1   No current facility-administered medications for this visit.    REVIEW OF SYSTEMS:  Constitutional: positive for anorexia, fatigue and weight loss Eyes: negative Ears, nose, mouth, throat, and face: negative Respiratory: negative Cardiovascular: negative Gastrointestinal: negative Genitourinary:negative Integument/breast: negative Hematologic/lymphatic: negative Musculoskeletal:positive for back pain Neurological: negative Behavioral/Psych: negative Endocrine: negative Allergic/Immunologic: negative   PHYSICAL EXAMINATION: General appearance: alert, cooperative and no distress Head: Normocephalic, without obvious abnormality, atraumatic Neck: no adenopathy, no carotid bruit, supple, symmetrical, trachea midline and thyroid not enlarged, symmetric, no tenderness/mass/nodules Lymph nodes: Cervical, supraclavicular, and axillary nodes normal. Resp: clear to auscultation bilaterally and normal percussion bilaterally Back: symmetric, no curvature. ROM normal. No CVA tenderness. Cardio: regular rate and rhythm, S1, S2 normal, no murmur, click, rub or gallop GI: soft, non-tender; bowel sounds normal; no masses,  no organomegaly Extremities: extremities normal, atraumatic, no cyanosis or edema Neurologic: Alert and oriented X 3, normal strength and tone. Normal symmetric reflexes. Normal coordination and gait  ECOG PERFORMANCE STATUS: 1 - Symptomatic but completely ambulatory  Blood pressure 108/72, pulse 108,  temperature 97 F (36.1 C), temperature source Oral, resp. rate 18, height 5\' 8"  (1.727 m), weight 117 lb 14.4 oz (53.479 kg), SpO2 98.00%.  LABORATORY DATA: Lab Results  Component Value Date   WBC 9.4 01/23/2013   HGB 11.1* 01/23/2013   HCT 32.6* 01/23/2013   MCV 91.3 01/23/2013   PLT 70* 01/23/2013      Chemistry      Component Value Date/Time   NA 122* 01/23/2013 1405   K 4.2 01/23/2013 1405   CO2 26 01/23/2013 1405   BUN 26.9* 01/23/2013 1405   CREATININE 0.7 01/23/2013 1405      Component Value Date/Time   CALCIUM 8.7 01/23/2013 1405   ALKPHOS 224* 01/23/2013 1405   AST 31 01/23/2013 1405   ALT 47 01/23/2013 1405   BILITOT 0.32 01/23/2013 1405       RADIOGRAPHIC STUDIES: Mr Thoracic Spine W Wo Contrast  12/29/2012   CLINICAL DATA:  67 year old male with metastatic lung cancer. Pathologic compression fractures in the lumbar spine with spinal stenosis. Severe back pain. New onset of lower extremity numbness and difficulty walking.  EXAM: MRI THORACIC SPINE WITHOUT AND WITH CONTRAST  TECHNIQUE: Multiplanar and multiecho pulse sequences of the thoracic spine were obtained without and  with intravenous contrast.  CONTRAST:  12mL MULTIHANCE GADOBENATE DIMEGLUMINE 529 MG/ML IV SOLN  COMPARISON:  Lumbar MRI 12/13/2012.  Chest CT 12/20/2012.  FINDINGS: Thoracic numbering agrees with the 12/13/2012 lumbar study. Limited sagittal imaging of the cervical spine suggests mild if any cervical osseous metastatic disease.  Advanced thoracic osseous metastases. Only of the T1 and T8 vertebral bodies appear to remain spared at this time. Multilevel posterior rib metastases. Multilevel thoracic posterior element metastases. There is a moderate pathologic compression fracture of T10 with diffuse vertebral involvement by tumor at that level and confluent medial right 10th rib involvement. There is mild epidural extension of tumor and mild retropulsion of the posterior vertebral body resulting in mild spinal  stenosis at this level. No associated spinal cord signal abnormality.  In the upper lumbar spine epidural tumor is severe at L2, and today's post-contrast images demonstrate associated confluent abnormal dural thickening and enhancement tracking cephalad from the lumbar spine to the posterior T10 level. Subsequent moderate to severe spinal stenosis at the superior L2 level which appears to correspond to the conus medullaris. Still, no definite conus or lower thoracic spinal cord signal abnormality.  Normal thoracic spine thecal sac patency above the T10 level. Mild ventral epidural tumor at T11 without significant spinal stenosis. Mild circumferential epidural tumor at T12 with borderline to mild spinal stenosis. No abnormal intradural enhancement identified.  Abnormal lung parenchyma, appears stable since 12/20/12. Trace partially loculated right pleural effusion.  IMPRESSION: 1. 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 Today's post-contrast images demonstrate extension of dural tumor into the lower thoracic spine as far as the T10 level.  2. Widespread thoracic metastatic disease. T10 moderate pathologic compression fracture. 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  Study discussed by telephone with Dr. Welton Flakes on 12/29/2012 at 1508 hr.   Electronically Signed   By: Augusto Gamble   On: 12/29/2012 15:10    ASSESSMENT AND PLAN: This is a very pleasant 67 years old African American male with metastatic non-small cell lung cancer, adenocarcinoma status post several treatments in the past including concurrent chemoradiation with cisplatin and etoposide as well as carboplatin, paclitaxel and Avastin followed by maintenance Avastin discontinued secondary to disease progression. The patient was then treated with single agent Alimta which  again was discontinued secondary to disease progression and has been off treatment since May of 2014. He completed palliative radiotherapy to the metastatic bone disease in the lower thoracic and upper lumbar spines tomorrow. 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 I discussed with him his treatment options including systemic chemotherapy with single agent docetaxel versus oral Tarceva. There is a persistent thrombocytopenia so the patient will not be a candidate for systemic chemotherapy at this time. I recommended for the patient treatment with oral Tarceva 150 mg by mouth daily. I discussed with the patient adverse effect of this treatment including but not limited to skin rash, diarrhea, interstitial lung disease, liver or renal dysfunction. The patient was given handout about Tarceva and he signed consent to proceed with the treatment. I will send this prescription to [facility] patient pharmacy. He would come back for followup visit in 2 weeks for reevaluation and management any adverse effect of his treatment. The patient was advised to call immediately if he has any concerning symptoms in the interval.  The  patient voices understanding of current disease status and treatment options and is in agreement with the current care plan.  All questions were answered. The patient knows to call the clinic with any problems, questions or concerns. We can certainly see the patient much sooner if necessary.  I spent 15 minutes counseling the patient face to face. The total time spent in the appointment was 25 minutes.

## 2013-01-23 NOTE — Patient Instructions (Addendum)
CURRENT THERAPY: Tarceva 150 mg by mouth daily. First dose expected in the next few days.  CHEMOTHERAPY INTENT: Palliative  CURRENT # OF CHEMOTHERAPY CYCLES: 1  CURRENT ANTIEMETICS: Compazine  CURRENT SMOKING STATUS: Former smoker  ORAL CHEMOTHERAPY AND CONSENT: Tarceva and consent was signed 01/23/2013  CURRENT BISPHOSPHONATES USE: None  PAIN MANAGEMENT: 1/10 lower back  NARCOTICS INDUCED CONSTIPATION: None  LIVING WILL AND CODE STATUS: ?

## 2013-01-24 ENCOUNTER — Ambulatory Visit
Admission: RE | Admit: 2013-01-24 | Discharge: 2013-01-24 | Disposition: A | Discharge status: Home / Self Care | Payer: Medicare Other | Source: Ambulatory Visit | Attending: Radiation Oncology | Admitting: Radiation Oncology

## 2013-01-24 NOTE — Progress Notes (Signed)
Billy Collier here in a wheel chair with his wife for final treatment visit.  He has had 10 fractions to his low T and L spine.  He denies pain.  He denies pain with swallowing although he reports a poor appetite.  He is able to swallow liquids and is drinking 3-4 ensures per day plus bottles of water.  He states he tries to eat but is not able to.  He denies nausea.  He is currently tapering off Decadron.  He is taking 2 tablets in the am, 1 at lunch and 1 in the afternoon.  He says that he is weak at times.  He denies dizziness with standing.

## 2013-01-24 NOTE — Progress Notes (Signed)
[facility]    Radiation Oncology [address] [place]     Maryln Gottron, M.D. [place], [place] [address]               [name], M.D., Ph.D. Phone: [phone]      Molli Hazard A. Kathrynn Running, M.D. Fax: [phone]      Radene Gunning, M.D., Ph.D.         Lurline Hare, M.D.         Grayland Jack, M.D Weekly Treatment Management Note  Name: Billy Collier     MRN: [id number]        CSN: [id number] Date: 01/24/2013      DOB: 08-31-1945  CC: Provider Not In System         No ref. provider found    Status: Outpatient  Diagnosis: The encounter diagnosis was Lung cancer, right.  Current Dose: 35 Gy  Current Fraction: 14  Planned Dose: 35 Gy  Narrative: [name] was seen today for weekly treatment management. The chart was checked and port films  were reviewed. The patient completes his therapy today. He has had good improvement in his low back pain. He denies any focal motor weakness. He does have a poor appetite which may be related to his radiation treatment to the lower thoracic and upper lumbar spine area.  Review of patient's allergies indicates no known allergies.  Current Outpatient Prescriptions  Medication Sig Dispense Refill  . Alum & Mag Hydroxide-Simeth (MAGIC MOUTHWASH W/LIDOCAINE) SOLN Take 15 mLs by mouth 3 (three) times daily as needed (before meals as needed).  480 mL  2  . anti-nausea (EMETROL) solution Take 10 mLs by mouth every 15 (fifteen) minutes as needed for nausea.      . cyclobenzaprine (FLEXERIL) 5 MG tablet Take 1 tablet (5 mg total) by mouth 3 (three) times daily as needed for muscle spasms.  30 tablet  0  . dexamethasone (DECADRON) 4 MG tablet Take 4 mg by mouth as directed. Decadron Taper Starting today - 01/18/13  8 mg in AM, 8 mg at lunch, 4 mg at dinner for 7 days.  8 mg in AM, 8 mg at dinner for 7 days 8 mg in AM, 4 mg at dinner for 7 days 4 mg in AM, 4 mg at dinner for 7 days 4 mg in AM for 7 days 2 mg in AM for 7 days.  Then Stop      . diazepam (VALIUM) 5 MG tablet Take 1 tablet (5 mg total) by mouth every 8 (eight) hours as needed (muscle spasm).  15 tablet  0  . ENSURE (ENSURE) Take 237 mLs by mouth continuous dialysis.      Marland Kitchen erlotinib (TARCEVA) 150 MG tablet Take 1 tablet (150 mg total) by mouth daily. Take on an empty stomach 1 hour before meals or 2 hours after.  30 tablet  2  . ibuprofen (ADVIL,MOTRIN) 600 MG tablet Take 1 tablet (600 mg total) by mouth every 6 (six) hours as needed for pain.  30 tablet  0  . oxyCODONE-acetaminophen (PERCOCET/ROXICET) 5-325 MG per tablet Take 1-2 tablets by mouth every 4 (four) hours as needed for pain.  90 tablet  0  . prochlorperazine (COMPAZINE) 10 MG tablet Take 1 tablet (10 mg total) by mouth every 6 (six) hours as needed.  30 tablet  1  . lisinopril-hydrochlorothiazide (PRINZIDE,ZESTORETIC) 10-12.5 MG per tablet Take 1 tablet by mouth daily.      Marland Kitchen  magnesium oxide (MAG-OX) 400 MG tablet Take 400 mg by mouth 2 (two) times daily.      . Simethicone (GAS-X PO) Take 1 tablet by mouth daily as needed (gas).      . sucralfate (CARAFATE) 1 GM/10ML suspension Take 10 mLs (1 g total) by mouth 4 (four) times daily.  420 mL  1   No current facility-administered medications for this encounter.   Labs:  Lab Results  Component Value Date   WBC 9.4 01/23/2013   HGB 11.1* 01/23/2013   HCT 32.6* 01/23/2013   MCV 91.3 01/23/2013   PLT 70* 01/23/2013   Lab Results  Component Value Date   CREATININE 0.7 01/23/2013   BUN 26.9* 01/23/2013   NA 122* 01/23/2013   K 4.2 01/23/2013   CO2 26 01/23/2013   Lab Results  Component Value Date   ALT 47 01/23/2013   AST 31 01/23/2013   BILITOT 0.32 01/23/2013    Physical Examination:  height is 5\' 8"  (1.727 m) and weight is 119 lb 1.6 oz (54.023 kg). His temperature is 97.4 F (36.3 C). His blood pressure is 111/78 and his pulse is 114. His oxygen saturation is 100%.    Wt Readings from Last 3 Encounters:  01/24/13 119 lb 1.6 oz (54.023  kg)  01/23/13 117 lb 14.4 oz (53.479 kg)  01/17/13 120 lb 12.8 oz (54.795 kg)     Lungs - Normal respiratory effort, chest expands symmetrically. Lungs are clear to auscultation, no crackles or wheezes.  Heart has regular rhythm and rate  Abdomen is soft and non tender with normal bowel sounds Good strength in his lower extremities.  Assessment:  Patient tolerated treatments well  Plan: Routine followup in one month. Patient will begin Tarceva tomorrow.

## 2013-01-26 ENCOUNTER — Encounter: Payer: Non-veteran care | Admitting: Nutrition

## 2013-01-27 ENCOUNTER — Inpatient Hospital Stay (HOSPITAL_COMMUNITY): Payer: Medicare Other

## 2013-01-27 ENCOUNTER — Inpatient Hospital Stay (HOSPITAL_COMMUNITY)
Admission: EM | Admit: 2013-01-27 | Discharge: 2013-01-30 | DRG: 180 | Disposition: A | Discharge status: Home / Self Care | Payer: Non-veteran care | Attending: Internal Medicine | Admitting: Internal Medicine

## 2013-01-27 ENCOUNTER — Encounter (HOSPITAL_COMMUNITY): Payer: Self-pay

## 2013-01-27 ENCOUNTER — Telehealth: Payer: Self-pay | Admitting: Dietician

## 2013-01-27 DIAGNOSIS — E860 Dehydration: Secondary | ICD-10-CM | POA: Diagnosis present

## 2013-01-27 DIAGNOSIS — R5381 Other malaise: Secondary | ICD-10-CM | POA: Diagnosis present

## 2013-01-27 DIAGNOSIS — Z923 Personal history of irradiation: Secondary | ICD-10-CM

## 2013-01-27 DIAGNOSIS — E43 Unspecified severe protein-calorie malnutrition: Secondary | ICD-10-CM | POA: Diagnosis present

## 2013-01-27 DIAGNOSIS — K3189 Other diseases of stomach and duodenum: Secondary | ICD-10-CM | POA: Diagnosis present

## 2013-01-27 DIAGNOSIS — E871 Hypo-osmolality and hyponatremia: Secondary | ICD-10-CM | POA: Diagnosis present

## 2013-01-27 DIAGNOSIS — I959 Hypotension, unspecified: Secondary | ICD-10-CM | POA: Diagnosis present

## 2013-01-27 DIAGNOSIS — D696 Thrombocytopenia, unspecified: Secondary | ICD-10-CM | POA: Diagnosis present

## 2013-01-27 DIAGNOSIS — I10 Essential (primary) hypertension: Secondary | ICD-10-CM | POA: Diagnosis present

## 2013-01-27 DIAGNOSIS — R0602 Shortness of breath: Secondary | ICD-10-CM | POA: Diagnosis present

## 2013-01-27 DIAGNOSIS — D630 Anemia in neoplastic disease: Secondary | ICD-10-CM | POA: Diagnosis present

## 2013-01-27 DIAGNOSIS — Z681 Body mass index (BMI) 19 or less, adult: Secondary | ICD-10-CM

## 2013-01-27 DIAGNOSIS — C7951 Secondary malignant neoplasm of bone: Secondary | ICD-10-CM | POA: Diagnosis present

## 2013-01-27 DIAGNOSIS — R110 Nausea: Secondary | ICD-10-CM | POA: Diagnosis present

## 2013-01-27 DIAGNOSIS — C3490 Malignant neoplasm of unspecified part of unspecified bronchus or lung: Principal | ICD-10-CM | POA: Diagnosis present

## 2013-01-27 DIAGNOSIS — D6959 Other secondary thrombocytopenia: Secondary | ICD-10-CM | POA: Diagnosis present

## 2013-01-27 DIAGNOSIS — T451X5A Adverse effect of antineoplastic and immunosuppressive drugs, initial encounter: Secondary | ICD-10-CM | POA: Diagnosis present

## 2013-01-27 DIAGNOSIS — R197 Diarrhea, unspecified: Secondary | ICD-10-CM | POA: Diagnosis present

## 2013-01-27 DIAGNOSIS — K5289 Other specified noninfective gastroenteritis and colitis: Secondary | ICD-10-CM | POA: Diagnosis present

## 2013-01-27 LAB — CBC WITH DIFFERENTIAL/PLATELET
Basophils Absolute: 0 10*3/uL (ref 0.0–0.1)
Lymphs Abs: 0.3 10*3/uL — ABNORMAL LOW (ref 0.7–4.0)
MCV: 83.6 fL (ref 78.0–100.0)
Monocytes Absolute: 0.3 10*3/uL (ref 0.1–1.0)
Monocytes Relative: 3 % (ref 3–12)
Neutrophils Relative %: 94 % — ABNORMAL HIGH (ref 43–77)
Platelets: 51 10*3/uL — ABNORMAL LOW (ref 150–400)
RBC: 3.83 MIL/uL — ABNORMAL LOW (ref 4.22–5.81)
RDW: 17.9 % — ABNORMAL HIGH (ref 11.5–15.5)
WBC: 8.5 10*3/uL (ref 4.0–10.5)

## 2013-01-27 LAB — COMPREHENSIVE METABOLIC PANEL
Albumin: 2.8 g/dL — ABNORMAL LOW (ref 3.5–5.2)
BUN: 20 mg/dL (ref 6–23)
Calcium: 8.7 mg/dL (ref 8.4–10.5)
GFR calc Af Amer: >90 mL/min (ref >90)
Glucose, Bld: 123 mg/dL — ABNORMAL HIGH (ref 70–99)
Sodium: 118 mEq/L — CL (ref 135–145)
Total Protein: 5.7 g/dL — ABNORMAL LOW (ref 6.0–8.3)

## 2013-01-27 LAB — URINE MICROSCOPIC-ADD ON

## 2013-01-27 LAB — URINALYSIS, ROUTINE W REFLEX MICROSCOPIC
Nitrite: NEGATIVE
Specific Gravity, Urine: 1.016 (ref 1.005–1.030)
pH: 7.5 (ref 5.0–8.0)

## 2013-01-27 MED ORDER — ACETAMINOPHEN 325 MG PO TABS: 650.0000 mg | ORAL_TABLET | Freq: Four times a day (QID) | ORAL | Status: DC | PRN

## 2013-01-27 MED ORDER — SODIUM CHLORIDE 0.9 % IJ SOLN
3.0000 mL | Freq: Two times a day (BID) | INTRAMUSCULAR | Status: DC
  Administered 2013-01-27: 22:00:00 3 mL via INTRAVENOUS

## 2013-01-27 MED ORDER — ENSURE COMPLETE PO LIQD
237.0000 mL | Freq: Three times a day (TID) | ORAL | Status: DC
  Administered 2013-01-28 – 2013-01-30 (×2): 237 mL via ORAL
  Filled 2013-01-27 (×9): qty 237

## 2013-01-27 MED ORDER — HYDROCORTISONE SOD SUCCINATE 100 MG IJ SOLR
50.0000 mg | Freq: Three times a day (TID) | INTRAMUSCULAR | Status: DC
  Administered 2013-01-28: 50 mg via INTRAVENOUS
  Filled 2013-01-27 (×5): qty 1

## 2013-01-27 MED ORDER — DIAZEPAM 5 MG PO TABS: 5.0000 mg | ORAL_TABLET | Freq: Three times a day (TID) | ORAL | Status: DC | PRN

## 2013-01-27 MED ORDER — ONDANSETRON HCL 4 MG/2ML IJ SOLN
4.0000 mg | Freq: Four times a day (QID) | INTRAMUSCULAR | Status: DC | PRN
  Administered 2013-01-27: 4 mg via INTRAVENOUS
  Filled 2013-01-27: qty 2

## 2013-01-27 MED ORDER — SODIUM CHLORIDE 0.9 % IV BOLUS (SEPSIS)
1000.0000 mL | Freq: Once | INTRAVENOUS | Status: AC
  Administered 2013-01-27: 1000 mL via INTRAVENOUS

## 2013-01-27 MED ORDER — ACETAMINOPHEN 650 MG RE SUPP: 650.0000 mg | Freq: Four times a day (QID) | RECTAL | Status: DC | PRN

## 2013-01-27 MED ORDER — MORPHINE SULFATE 2 MG/ML IJ SOLN: 1.0000 mg | INTRAMUSCULAR | Status: DC | PRN

## 2013-01-27 MED ORDER — ONDANSETRON HCL 4 MG PO TABS: 4.0000 mg | ORAL_TABLET | Freq: Four times a day (QID) | ORAL | Status: DC | PRN

## 2013-01-27 MED ORDER — PANTOPRAZOLE SODIUM 40 MG IV SOLR
40.0000 mg | Freq: Every day | INTRAVENOUS | Status: DC
  Administered 2013-01-28: 40 mg via INTRAVENOUS
  Filled 2013-01-27 (×2): qty 40

## 2013-01-27 MED ORDER — ALUM & MAG HYDROXIDE-SIMETH 200-200-20 MG/5ML PO SUSP: 30.0000 mL | ORAL | Status: DC | PRN

## 2013-01-27 MED ORDER — SODIUM CHLORIDE 0.9 % IV SOLN
INTRAVENOUS | Status: DC
  Administered 2013-01-27 – 2013-01-30 (×6): via INTRAVENOUS

## 2013-01-27 MED ORDER — ONDANSETRON HCL 4 MG/2ML IJ SOLN
4.0000 mg | Freq: Once | INTRAMUSCULAR | Status: AC
  Administered 2013-01-27: 4 mg via INTRAVENOUS
  Filled 2013-01-27: qty 2

## 2013-01-27 MED ORDER — OXYCODONE-ACETAMINOPHEN 5-325 MG PO TABS
1.0000 | ORAL_TABLET | ORAL | Status: DC | PRN
  Administered 2013-01-29 – 2013-01-30 (×3): 1 via ORAL
  Administered 2013-01-30: 2 via ORAL
  Filled 2013-01-27 (×3): qty 1
  Filled 2013-01-27: qty 2

## 2013-01-27 MED ORDER — CYCLOBENZAPRINE HCL 5 MG PO TABS
5.0000 mg | ORAL_TABLET | Freq: Three times a day (TID) | ORAL | Status: DC | PRN
  Filled 2013-01-27: qty 1

## 2013-01-27 NOTE — Telephone Encounter (Signed)
Brief Outpatient Oncology Nutrition Note  Patient has been identified to be at risk on malnutrition screen.  Wt Readings from Last 10 Encounters:  01/27/13 120 lb (54.432 kg)  01/24/13 119 lb 1.6 oz (54.023 kg)  01/23/13 117 lb 14.4 oz (53.479 kg)  01/17/13 120 lb 12.8 oz (54.795 kg)  01/10/13 124 lb 9.6 oz (56.518 kg)  01/04/13 129 lb 12.8 oz (58.877 kg)  01/04/13 129 lb 9.6 oz (58.786 kg)  12/29/12 134 lb 6.4 oz (60.963 kg)  12/14/12 138 lb 6.4 oz (62.778 kg)  12/08/12 143 lb (64.864 kg)    Patient with metastatic non-small cell lung cancer, adenocarcinoma with negative EGFR mutation and negative ALK gene translocation as well as negative KRAS initially diagnosed as stage IIIB in June of 2012.  Patient with diarrhea, poor appetite and weakness over the past 4 days now in the ER. Patient s/p palliative chemo.   Oran Rein, RD, LDN

## 2013-01-27 NOTE — ED Provider Notes (Signed)
CSN: [id number]     Arrival date & time 01/27/13  1140 History   First MD Initiated Contact with Patient 01/27/13 1232     Chief Complaint  Patient presents with  . Weakness    HPI Patient reports that he has been feeling weak and having loss of ap-petite x 4 days. Patient denise N/V/D and states that his BM's are normal. Patient denies any pain.  Patient has known history of metastatic lung cancer we'll with known metastasis in the lumbar spine.  He is currently undergoing radiation therapy.  He is also undergoing chemotherapy.  He states it is not been drinking fluids were being the last 2-3 days and become increasingly weak.  He lives by himself. Past Medical History  Diagnosis Date  . Hypertension   . PTSD (post-traumatic stress disorder)   . Chronic back pain   . Cancer   . Lung cancer    History reviewed. No pertinent past surgical history. History reviewed. No pertinent family history. History  Substance Use Topics  . Smoking status: Current Some Day Smoker    Types: Cigarettes  . Smokeless tobacco: Never Used  . Alcohol Use: No    Review of Systems All other systems reviewed and are negative Allergies  Review of patient's allergies indicates no known allergies.  Home Medications   No current outpatient prescriptions on file. BP 99/64  Pulse 97  Temp(Src) 97.9 F (36.6 C) (Oral)  Resp 15  Ht 5\' 10"  (1.778 m)  Wt 113 lb 15.7 oz (51.7 kg)  BMI 16.35 kg/m2  SpO2 100% Physical Exam  Nursing note and vitals reviewed. Constitutional: He is oriented to person, place, and time. He appears cachectic.  Non-toxic appearance. He appears ill. No distress.  HENT:  Head: Normocephalic and atraumatic.  Eyes: Pupils are equal, round, and reactive to light.  Neck: Normal range of motion.  Cardiovascular: Normal rate and intact distal pulses.   Pulmonary/Chest: No respiratory distress.  Abdominal: Normal appearance. He exhibits no distension.  Musculoskeletal: Normal range  of motion.  Neurological: He is alert and oriented to person, place, and time. No cranial nerve deficit.  Skin: Skin is warm and dry. No rash noted.  Psychiatric: He has a normal mood and affect. His behavior is normal.    ED Course  Procedures (including critical care time) Labs Review Labs Reviewed  CBC WITH DIFFERENTIAL - Abnormal; Notable for the following:    RBC 3.83 (*)    Hemoglobin 11.9 (*)    HCT 32.0 (*)    MCHC 37.2 (*)    RDW 17.9 (*)    Platelets 51 (*)    Neutrophils Relative % 94 (*)    Lymphocytes Relative 3 (*)    Neutro Abs 7.9 (*)    Lymphs Abs 0.3 (*)    All other components within normal limits  COMPREHENSIVE METABOLIC PANEL - Abnormal; Notable for the following:    Sodium 118 (*)    Chloride 80 (*)    Glucose, Bld 123 (*)    Total Protein 5.7 (*)    Albumin 2.8 (*)    Alkaline Phosphatase 246 (*)    All other components within normal limits  URINALYSIS, ROUTINE W REFLEX MICROSCOPIC - Abnormal; Notable for the following:    APPearance CLOUDY (*)    Glucose, UA 100 (*)    Ketones, ur 15 (*)    Protein, ur 30 (*)    Leukocytes, UA MODERATE (*)    All other components within  normal limits  CBC - Abnormal; Notable for the following:    RBC 3.13 (*)    Hemoglobin 9.6 (*)    HCT 26.5 (*)    MCHC 36.2 (*)    RDW 18.4 (*)    Platelets 41 (*)    All other components within normal limits  COMPREHENSIVE METABOLIC PANEL - Abnormal; Notable for the following:    Sodium 123 (*)    Chloride 90 (*)    Calcium 7.6 (*)    Total Protein 4.5 (*)    Albumin 2.1 (*)    Alkaline Phosphatase 206 (*)    All other components within normal limits  CLOSTRIDIUM DIFFICILE BY PCR  URINE MICROSCOPIC-ADD ON   Imaging Review Dg Chest Port 1 View  01/27/2013   CLINICAL DATA:  Weakness. History of lung carcinoma and hypertension.  EXAM: PORTABLE CHEST - 1 VIEW  COMPARISON:  Chest CT, 12/1912  FINDINGS: The cardiac silhouette is normal in size. The aorta is mildly uncoiled.  There is scarring in the right upper lobe leading to hilar retraction superiorly. There is no convincing infiltrate. There is no pulmonary edema. No lung mass or nodule is seen. No pleural effusion or pneumothorax.  Mild compression fractures are noted of the lower thoracic vertebrae and L1 which were present on the prior CT. These are pathologic.  IMPRESSION: No acute findings.   Electronically Signed   By: Amie Portland   On: 01/27/2013 20:13    MDM   1. Hyponatremia   2. Lung cancer, unspecified laterality   3. Dehydration   4. Hypotension       Nelia Shi, MD 01/28/13 [phone]

## 2013-01-27 NOTE — ED Notes (Signed)
Pt report he had diarrhea  Everyday and last had it this AM.

## 2013-01-27 NOTE — H&P (Signed)
[facility] History and Physical  Billy Collier ZOX:[id number] DOB: [birth year] DOA: 01/27/2013   PCP:  None Specialists: Dr. Arbutus Ped is his oncologist  Chief Complaint: Weakness with nausea for last 3 days  HPI: Billy Collier is a 67 y.o. 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 He has completed multiple cycles of chemotherapy and is currently receiving Tarceva, which was initiated probably 1-2 days ago. He completed a course of palliative radiotherapy to his spine on September 23. He was in his usual state of health till about 3 days ago, when he started having weakness. He started having nausea without any vomiting. He couldn't eat or drink. Denies any abdominal pain. He has been having indigestion. He has had loose stools, but not profusely. Has been small quantity about once or twice a day. Has been dizzy, but denies any syncopal episodes. Denies any bleeding from anywhere. Denies any black stools. No fever, chills. No sick contacts. He's lost about 14 pounds in the last 5 weeks. He does get some shortness of breath with exertion, but none at rest. Denies any leg swelling. He is able to ambulate independently at this time. Not a very good historian. Patient appears to have expectations regarding his cancer.   Home Medications: Prior to Admission medications   Medication Sig Start Date End Date Taking? Authorizing Provider  cyclobenzaprine (FLEXERIL) 5 MG tablet Take 1 tablet (5 mg total) by mouth 3 (three) times daily as needed for muscle spasms. 01/11/13  Yes [name], MD  dexamethasone (DECADRON) 4 MG tablet Take 2-8 mg by mouth as directed. Decadron Taper Starting today - 01/18/13  8 mg in AM, 8 mg at lunch, 4 mg at dinner for 7 days.  8 mg in AM, 8 mg at dinner for 7 days 8 mg in AM, 4 mg at dinner for 7 days 4 mg in AM, 4 mg at dinner for 7 days 4 mg in AM for 7 days 2 mg in AM for 7 days. Then Stop  01/18/13  Yes Lurline Hare, MD  diazepam (VALIUM) 5 MG tablet Take 1 tablet (5 mg total) by mouth every 8 (eight) hours as needed (muscle spasm). 12/08/12  Yes Raynelle Fanning Idol, PA-C  ENSURE (ENSURE) Take 237 mLs by mouth 3 (three) times daily between meals.    Yes Historical Provider, MD  ibuprofen (ADVIL,MOTRIN) 600 MG tablet Take 1 tablet (600 mg total) by mouth every 6 (six) hours as needed for pain. 12/08/12  Yes Burgess Amor, PA-C  lisinopril-hydrochlorothiazide (PRINZIDE,ZESTORETIC) 10-12.5 MG per tablet Take 1 tablet by mouth daily.   Yes Historical Provider, MD  magnesium oxide (MAG-OX) 400 MG tablet Take 400 mg by mouth 2 (two) times daily.   Yes Historical Provider, MD  oxyCODONE-acetaminophen (PERCOCET/ROXICET) 5-325 MG per tablet Take 1-2 tablets by mouth every 4 (four) hours as needed for pain. 01/11/13  Yes [name], MD  prochlorperazine (COMPAZINE) 10 MG tablet Take 10 mg by mouth every 6 (six) hours as needed (for nausea/vomiting).   Yes Historical Provider, MD  Alum & Mag Hydroxide-Simeth (MAGIC MOUTHWASH W/LIDOCAINE) SOLN Take 15 mLs by mouth 3 (three) times daily as needed (before meals as needed). 01/17/13   Lurline Hare, MD  erlotinib (TARCEVA) 150 MG tablet Take 1 tablet (150 mg total) by mouth daily. Take on an empty stomach 1 hour before meals or 2 hours after. 01/23/13   Si Gaul, MD  Allergies: No Known Allergies  Past Medical History: Past Medical History  Diagnosis Date  . Hypertension   . PTSD (post-traumatic stress disorder)   . Chronic back pain   . Cancer   . Lung cancer     History reviewed. No pertinent past surgical history.  Social History:  reports that he has been smoking Cigarettes.  He has been smoking about 0.00 packs per day. He does not have any smokeless tobacco history on file. He reports that he does not drink alcohol or use illicit drugs.  Living Situation: Lives by himself in [place], [place] Activity Level: Independent with daily  activities   Family History: Denies any medical problems in the family  Review of Systems - History obtained from the patient General ROS: positive for  - fatigue Psychological ROS: negative Ophthalmic ROS: negative ENT ROS: negative Allergy and Immunology ROS: negative Hematological and Lymphatic ROS: negative Endocrine ROS: negative Respiratory ROS: positive for - shortness of breath with exertion only Cardiovascular ROS: no chest pain or dyspnea on exertion Gastrointestinal ROS: positive for - indigestion Genito-Urinary ROS: no dysuria, trouble voiding, or hematuria Musculoskeletal ROS: back pain is better with radiation Neurological ROS: no TIA or stroke symptoms Dermatological ROS: negative  Physical Examination  Filed Vitals:   01/27/13 1303 01/27/13 1330 01/27/13 1400 01/27/13 1430  BP: 101/66 106/70 97/80 100/76  Pulse: 103 103 105 107  Temp:      TempSrc:      Resp: 12 17 14 15   Height:      Weight:      SpO2: 100% 100% 100% 99%    General appearance: alert, cooperative, appears stated age, cachectic and no distress Head: Normocephalic, without obvious abnormality, atraumatic Eyes: conjunctivae/corneas clear. PERRL, EOM's intact.  Throat: dry mm Neck: no adenopathy, no carotid bruit, no JVD, supple, symmetrical, trachea midline and thyroid not enlarged, symmetric, no tenderness/mass/nodules Resp: Few rhonchi bilaterally. No crackles. No obvious wheezes. Cardio: regular rate and rhythm, S1, S2 normal, no murmur, click, rub or gallop GI: soft, non-tender; bowel sounds normal; no masses,  no organomegaly Extremities: extremities normal, atraumatic, no cyanosis or edema Pulses: 2+ and symmetric Skin: Skin color, texture, turgor normal. No rashes or lesions Lymph nodes: Cervical, supraclavicular, and axillary nodes normal. Neurologic: Alert and oriented X 3, normal strength and tone. Able to lift both his legs off the bed. No weakness was appreciated  Laboratory  Data: Results for orders placed during the hospital encounter of 01/27/13 (from the past 48 hour(s))  CBC WITH DIFFERENTIAL     Status: Abnormal   Collection Time    01/27/13 12:41 PM      Result Value Range   WBC 8.5  4.0 - 10.5 K/uL   RBC 3.83 (*) 4.22 - 5.81 MIL/uL   Hemoglobin 11.9 (*) 13.0 - 17.0 g/dL   HCT 16.1 (*) 09.6 - 04.5 %   MCV 83.6  78.0 - 100.0 fL   MCH 31.1  26.0 - 34.0 pg   MCHC 37.2 (*) 30.0 - 36.0 g/dL   Comment: RULED OUT INTERFERING SUBSTANCES   RDW 17.9 (*) 11.5 - 15.5 %   Platelets 51 (*) 150 - 400 K/uL   Comment: SPECIMEN CHECKED FOR CLOTS     REPEATED TO VERIFY   Neutrophils Relative % 94 (*) 43 - 77 %   Lymphocytes Relative 3 (*) 12 - 46 %   Monocytes Relative 3  3 - 12 %   Eosinophils Relative 0  0 - 5 %  Basophils Relative 0  0 - 1 %   Neutro Abs 7.9 (*) 1.7 - 7.7 K/uL   Lymphs Abs 0.3 (*) 0.7 - 4.0 K/uL   Monocytes Absolute 0.3  0.1 - 1.0 K/uL   Eosinophils Absolute 0.0  0.0 - 0.7 K/uL   Basophils Absolute 0.0  0.0 - 0.1 K/uL   RBC Morphology RARE NRBCs     Comment: BASOPHILIC STIPPLING   WBC Morphology MILD LEFT SHIFT (1-5% METAS, OCC MYELO, OCC BANDS)     Comment: TOXIC GRANULATION     VACUOLATED NEUTROPHILS   Smear Review PLATELET COUNT CONFIRMED BY SMEAR    COMPREHENSIVE METABOLIC PANEL     Status: Abnormal   Collection Time    01/27/13 12:41 PM      Result Value Range   Sodium 118 (*) 135 - 145 mEq/L   Comment: CRITICAL RESULT CALLED TO, READ BACK BY AND VERIFIED WITH:     BEDNAR,J. MD AT 1324 01/27/13 BARFIELD,T   Potassium 3.7  3.5 - 5.1 mEq/L   Chloride 80 (*) 96 - 112 mEq/L   CO2 24  19 - 32 mEq/L   Glucose, Bld 123 (*) 70 - 99 mg/dL   BUN 20  6 - 23 mg/dL   Creatinine, Ser 1.02  0.50 - 1.35 mg/dL   Calcium 8.7  8.4 - 72.5 mg/dL   Total Protein 5.7 (*) 6.0 - 8.3 g/dL   Albumin 2.8 (*) 3.5 - 5.2 g/dL   AST 36  0 - 37 U/L   ALT 53  0 - 53 U/L   Alkaline Phosphatase 246 (*) 39 - 117 U/L   Total Bilirubin 0.8  0.3 - 1.2 mg/dL    GFR calc non Af Amer >90  >90 mL/min   GFR calc Af Amer >90  >90 mL/min   Comment: (NOTE)     The eGFR has been calculated using the CKD EPI equation.     This calculation has not been validated in all clinical situations.     eGFR's persistently <90 mL/min signify possible Chronic Kidney     Disease.    Radiology Reports: No results found.  Electrocardiogram: Sinus tachycardia at 112 beats per minute. Normal axis. Intervals are normal. No Q waves. No concerning ST or T-wave changes are noted.  Problem List  Principal Problem:   Lung cancer Active Problems:   Dehydration   Hyponatremia   Hypotension   Thrombocytopenia   Assessment: This is a 67 year old, unfortunate African American male, with metastatic lung cancer, who comes in with weakness, and is found to be very dehydrated, with hyponatremia. He was also hypotensive in the ED, which has improved somewhat with IV fluids. He is on chronic steroids and, so could have Addison's component as well. 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 He was recently started on Tarceva and I believe he took the first dose within the last day or 2. His symptomatology could be due to the Tarceva, although it's difficult to say.   Plan: #1 dehydration with hypotension and hyponatremia: We will give him IV fluids. We'll watch him closely on telemetry. We'll repeat his blood work in the morning. We will give him intravenous hydrocortisone for now for stress. Continue to monitor closely. UA and chest x-ray are pending.  #2 mild diarrhea: Could be related to the new chemotherapeutic agent Tarceva. We will check C. difficile, PCR. Monitor him closely.  #3 metastatic lung cancer with metastases to the spine: He  is completed radiotherapy just 2 days ago. He does not have any lower extremity weakness. He was started on Tarceva recently by his oncologist. He has completed multiple courses of chemotherapy. His long-term prognosis is not  clear. Pain medications as needed.  #4 history of hypertension: We'll hold his antihypertensive medications due to his low blood pressure.  #5 thrombocytopenia: Most likely related to the cancer. Monitor platelet counts closely. Avoid anticoagulants.  #6 indigestion: This is a chronic issue for him. He'll be given Maalox and PPI. Abdomen is benign.   DVT Prophylaxis: SCDs Code Status: Full code.  Family Communication: No family at bedside. Discussed with the patient  Disposition Plan: Admit to   Further management decisions will depend on results of further testing and patient's response to treatment.  [facility]  [facility] Pager [phone]  If 7PM-7AM, please contact night-coverage [email or web address] Password [facility]  01/27/2013, 3:02 PM

## 2013-01-27 NOTE — ED Notes (Signed)
Patient reports that he has been feeling weak and having loss of ap-petite x 4 days. Patient denise N/V/D and states that his BM's are normal. Patient denies any pain.

## 2013-01-27 NOTE — Progress Notes (Signed)
   CARE MANAGEMENT ED NOTE 01/27/2013  Patient:  Billy Collier, Billy Collier   Account Number:  [id number]  Date Initiated:  01/27/2013  Documentation initiated by:  Edd Arbour  Subjective/Objective Assessment:     Subjective/Objective Assessment Detail:   67 year old male medicare patient with hyponatremia NA 118, hypotension 88/66- 106/70 HR 107 given ns bolusm, zofran iv in [facility] ED  PMH non-small cell lung cancer, which is adenocarcinoma with metastases to his spine. He has completed multiple cycles of chemotherapy and is currently receiving Tarceva, which was initiated probably 1-2 days ago. He completed a course of palliative radiotherapy to his spine on September 23. He was in his usual state of health till about 3 days ago, when he started having weakness. He started having nausea without any vomiting. He couldn't eat or drink.     Action/Plan:   Action/Plan Detail:   CM spoke with pt without pcp listed and he states he primarily sees Dr Gwenyth Bouillon and has no medicare pcp  UR completed   Anticipated DC Date:  01/30/2013     Status Recommendation to Physician:   Result of Recommendation:    Other ED Services  Consult Working Plan    DC Planning Services  Other  Outpatient Services - Pt will follow up  PCP issues    Choice offered to / List presented to:            Status of service:  Completed, signed off  ED Comments:   ED Comments Detail:

## 2013-01-28 LAB — COMPREHENSIVE METABOLIC PANEL
ALT: 39 U/L (ref 0–53)
AST: 32 U/L (ref 0–37)
Albumin: 2.1 g/dL — ABNORMAL LOW (ref 3.5–5.2)
BUN: 14 mg/dL (ref 6–23)
CO2: 21 mEq/L (ref 19–32)
Calcium: 7.6 mg/dL — ABNORMAL LOW (ref 8.4–10.5)
Chloride: 90 mEq/L — ABNORMAL LOW (ref 96–112)
Creatinine, Ser: 0.52 mg/dL (ref 0.50–1.35)
GFR calc non Af Amer: >90 mL/min (ref >90)
Sodium: 123 mEq/L — ABNORMAL LOW (ref 135–145)
Total Bilirubin: 0.5 mg/dL (ref 0.3–1.2)

## 2013-01-28 LAB — CBC
HCT: 26.5 % — ABNORMAL LOW (ref 39.0–52.0)
MCV: 84.7 fL (ref 78.0–100.0)
Platelets: 41 10*3/uL — ABNORMAL LOW (ref 150–400)
RBC: 3.13 MIL/uL — ABNORMAL LOW (ref 4.22–5.81)
RDW: 18.4 % — ABNORMAL HIGH (ref 11.5–15.5)
WBC: 6.2 10*3/uL (ref 4.0–10.5)

## 2013-01-28 MED ORDER — HYDROCORTISONE SOD SUCCINATE 100 MG IJ SOLR
50.0000 mg | Freq: Two times a day (BID) | INTRAMUSCULAR | Status: DC
  Administered 2013-01-28: 50 mg via INTRAVENOUS
  Filled 2013-01-28 (×4): qty 1

## 2013-01-28 MED ORDER — PANTOPRAZOLE SODIUM 40 MG PO TBEC
40.0000 mg | DELAYED_RELEASE_TABLET | Freq: Every day | ORAL | Status: DC
  Administered 2013-01-29 – 2013-01-30 (×2): 40 mg via ORAL
  Filled 2013-01-28 (×3): qty 1

## 2013-01-28 NOTE — Evaluation (Signed)
Physical Therapy Evaluation Patient Details Name: Billy Collier MRN: [id number] DOB: [birth year] Today's Date: 01/28/2013 Time: 4098-1191 PT Time Calculation (min): 21 min  PT Assessment / Plan / Recommendation History of Present Illness  per H&P 9/26: " HPI: Billy Collier is a 67 y.o. 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 He has completed multiple cycles of chemotherapy and is currently receiving Tarceva, which was initiated probably 1-2 days ago. He completed a course of palliative radiotherapy to his spine on September 23. He was in his usual state of health till about 3 days ago, when he started having weakness. He started having nausea without any vomiting. He couldn't eat or drink. Denies any abdominal pain. He has been having indigestion. He has had loose stools, but not profusely. Has been small quantity about once or twice a day. Has been dizzy, but denies any syncopal episodes. Denies any bleeding from anywhere. Denies any black stools. No fever, chills. No sick contacts. He's lost about 14 pounds in the last 5 weeks. He does get some shortness of breath with exertion, but none at rest. Denies any leg swelling. He is able to ambulate independently at this time. Not a very good historian. Patient appears to have expectations regarding his cancer.  Clinical Impression  Pt with flat affect, pleasant , but very little words, or explanation. Did get that pt feels he feeling better and near baseline with mobility, just that he wants and knows he needs to walk to stay strong. Feel he will progress himself appropriately, and no need to follow from skilled PT standpoint. Educated nursing and pt to calling for nursing staff to ambulate pt in hallway when he feels like it.      PT Assessment  Patent does not need any further PT services    Follow Up Recommendations  No PT follow up (pt aware of how to progress himself at  home and here)    Does the patient have the potential to tolerate intense rehabilitation      Barriers to Discharge        Equipment Recommendations       Recommendations for Other Services     Frequency      Precautions / Restrictions Precautions Precautions: Other (comment) (enteric contact) Restrictions Weight Bearing Restrictions: No   Pertinent Vitals/Pain Pt denies any pain, however moves very guarded.       Mobility  Bed Mobility Bed Mobility: Supine to Sit;Sit to Supine Supine to Sit: 6: Modified independent (Device/Increase time);With rails Sit to Supine: 6: Modified independent (Device/Increase time);With rail Details for Bed Mobility Assistance: just moves very guraded and slow, but no assistance needed Transfers Transfers: Sit to Stand;Stand to Sit Sit to Stand: 6: Modified independent (Device/Increase time) Stand to Sit: 6: Modified independent (Device/Increase time) Ambulation/Gait Ambulation/Gait Assistance: 6: Modified independent (Device/Increase time) Ambulation Distance (Feet): 150 Feet Assistive device: 1 person hand held assist (and he pushed IV pole) Ambulation/Gait Assistance Details: slow and steady, small BOS, but pt states he is getting back to his baseline and feeling better Gait Pattern: Step-through pattern;Decreased stride length;Narrow base of support Gait velocity: slow    Exercises     PT Diagnosis:    PT Problem List:   PT Treatment Interventions:       PT Goals(Current goals can be found in the care plan section) Acute Rehab PT Goals Patient Stated Goal: to get home today hopefully PT Goal  Formulation: No goals set, d/c therapy (1x eval)  Visit Information  Last PT Received On: 01/28/13 Assistance Needed: +1 History of Present Illness: per H&P 9/26: " HPI: Billy Collier is a 67 y.o. 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 He has completed multiple  cycles of chemotherapy and is currently receiving Tarceva, which was initiated probably 1-2 days ago. He completed a course of palliative radiotherapy to his spine on September 23. He was in his usual state of health till about 3 days ago, when he started having weakness. He started having nausea without any vomiting. He couldn't eat or drink. Denies any abdominal pain. He has been having indigestion. He has had loose stools, but not profusely. Has been small quantity about once or twice a day. Has been dizzy, but denies any syncopal episodes. Denies any bleeding from anywhere. Denies any black stools. No fever, chills. No sick contacts. He's lost about 14 pounds in the last 5 weeks. He does get some shortness of breath with exertion, but none at rest. Denies any leg swelling. He is able to ambulate independently at this time. Not a very good historian. Patient appears to have expectations regarding his cancer.       Prior Functioning  Home Living Family/patient expects to be discharged to:: Private residence Living Arrangements: Alone (family lives 5 minutes away) Available Help at Discharge: Family;Available PRN/intermittently Type of Home: House Home Access: Stairs to enter [employer] of Steps: 1 Entrance Stairs-Rails: None Home Layout: One level Home Equipment: None Prior Function Level of Independence: Independent (driving) Communication Communication: No difficulties    Cognition  Cognition Arousal/Alertness: Awake/alert Behavior During Therapy: WFL for tasks assessed/performed;Flat affect Overall Cognitive Status: Within Functional Limits for tasks assessed    Extremity/Trunk Assessment Lower Extremity Assessment Lower Extremity Assessment: Overall WFL for tasks assessed   Balance    End of Session PT - End of Session Equipment Utilized During Treatment: Gait belt Activity Tolerance: Patient tolerated treatment well Patient left: in bed;with call bell/phone within  reach Nurse Communication: Mobility status (recommend pt walk with nursing)  GP     Marella Bile 01/28/2013, 10:42 AM Marella Bile, PT Pager: [phone] 01/28/2013

## 2013-01-28 NOTE — Progress Notes (Signed)
[facility] PROGRESS NOTE  Billy Collier ZOX:[id number] DOB: 1945/07/20 DOA: 01/27/2013 PCP: Provider Not In System  HPI: Billy Collier is a 67 y.o. 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 He has completed multiple cycles of chemotherapy and is currently receiving Tarceva, which was initiated probably 1-2 days ago. He completed a course of palliative radiotherapy to his spine on September 23. He was in his usual state of health till about 3 days ago, when he started having weakness. He started having nausea without any vomiting. He couldn't eat or drink. Denies any abdominal pain. He has been having indigestion. He has had loose stools, but not profusely. Has been small quantity about once or twice a day. Has been dizzy, but denies any syncopal episodes. Denies any bleeding from anywhere. Denies any black stools. No fever, chills. No sick contacts. He's lost about 14 pounds in the last 5 weeks. He does get some shortness of breath with exertion, but none at rest. Denies any leg swelling. He is able to ambulate independently at this time. Not a very good historian. Patient appears to have expectations regarding his cancer.   Assessment/Plan: Dehydration with hypotension and hyponatremia:  - continue IVF - decrease steroids to Q12. Hyponatremia - due to #1, improving, continue hydration. Diarrhea: Could be related to the new chemotherapeutic agent Tarceva. We will check C. difficile, PCR. Monitor him closely.  Metastatic lung cancer with metastases to the spine. Poor insight into his disease.  -He is completed radiotherapy just 2 days ago. He does not have any lower extremity weakness. He was started on Tarceva recently by his oncologist. He has completed multiple courses of chemotherapy. His long-term prognosis is not clear. Pain medications as needed.  History of hypertension: We'll hold his antihypertensive  medications due to his low blood pressure.  Thrombocytopenia: Most likely related to the cancer. Monitor platelet counts closely. Avoid anticoagulants.  Indigestion: This is a chronic issue for him. He'll be given Maalox and PPI. Abdomen is benign.  DVT prophylaxis - SCDs  Code Status: Full Family Communication: none  Disposition Plan: inpatient  Consultants:  none  Procedures:  none  HPI/Subjective: - feels weak, but otherwise has no complaints. Denies fever/chills/cough/shortness of breath/dysuria.  Objective: Filed Vitals:   01/27/13 1430 01/27/13 1700 01/27/13 2110 01/28/13 0509  BP: 100/76 113/75 108/74 99/64  Pulse: 107 108 100 97  Temp:  98.1 F (36.7 C) 98.1 F (36.7 C) 97.9 F (36.6 C)  TempSrc:  Oral Oral Oral  Resp: 15 14 15 15   Height:  5\' 10"  (1.778 m)    Weight:  51.7 kg (113 lb 15.7 oz)    SpO2: 99% 100% 99% 100%    Intake/Output Summary (Last 24 hours) at 01/28/13 0847 Last data filed at 01/28/13 8119  Gross per 24 hour  Intake 2193.75 ml  Output    352 ml  Net 1841.75 ml   Filed Weights   01/27/13 1156 01/27/13 1700  Weight: 54.432 kg (120 lb) 51.7 kg (113 lb 15.7 oz)    Exam:   General:  NAD, cachectic  Cardiovascular: regular rate and rhythm, without MRG  Respiratory: good air movement, clear to auscultation throughout, no wheezing, ronchi or rales  Abdomen: soft, not tender to palpation, positive bowel sounds  MSK: no peripheral edema  Neuro: CN 2-12 grossly intact, MS 5/5 in all 4  Data Reviewed: Basic Metabolic Panel:  Recent Labs Lab 01/27/13 1241  01/28/13 0445  NA 118* 123*  K 3.7 3.7  CL 80* 90*  CO2 24 21  GLUCOSE 123* 82  BUN 20 14  CREATININE 0.62 0.52  CALCIUM 8.7 7.6*   Liver Function Tests:  Recent Labs Lab 01/27/13 1241 01/28/13 0445  AST 36 32  ALT 53 39  ALKPHOS 246* 206*  BILITOT 0.8 0.5  PROT 5.7* 4.5*  ALBUMIN 2.8* 2.1*   No results found for this basename: LIPASE, AMYLASE,  in the last  168 hours No results found for this basename: AMMONIA,  in the last 168 hours CBC:  Recent Labs Lab 01/27/13 1241 01/28/13 0445  WBC 8.5 6.2  NEUTROABS 7.9*  --   HGB 11.9* 9.6*  HCT 32.0* 26.5*  MCV 83.6 84.7  PLT 51* 41*   Cardiac Enzymes: No results found for this basename: CKTOTAL, CKMB, CKMBINDEX, TROPONINI,  in the last 168 hours BNP (last 3 results) No results found for this basename: PROBNP,  in the last 8760 hours CBG: No results found for this basename: GLUCAP,  in the last 168 hours  Recent Results (from the past 240 hour(s))  TECHNOLOGIST REVIEW     Status: None   Collection Time    01/23/13  2:05 PM      Result Value Range Status   Technologist Review 2%nRBC, few targets   Final     Studies: Dg Chest Port 1 View  01/27/2013   CLINICAL DATA:  Weakness. History of lung carcinoma and hypertension.  EXAM: PORTABLE CHEST - 1 VIEW  COMPARISON:  Chest CT, 12/1912  FINDINGS: The cardiac silhouette is normal in size. The aorta is mildly uncoiled. There is scarring in the right upper lobe leading to hilar retraction superiorly. There is no convincing infiltrate. There is no pulmonary edema. No lung mass or nodule is seen. No pleural effusion or pneumothorax.  Mild compression fractures are noted of the lower thoracic vertebrae and L1 which were present on the prior CT. These are pathologic.  IMPRESSION: No acute findings.   Electronically Signed   By: Amie [place]   On: 01/27/2013 20:13    Scheduled Meds: . feeding supplement  237 mL Oral TID BM  . hydrocortisone sod succinate (SOLU-CORTEF) inj  50 mg Intravenous Q8H  . pantoprazole (PROTONIX) IV  40 mg Intravenous Daily  . sodium chloride  3 mL Intravenous Q12H   Continuous Infusions: . sodium chloride 125 mL/hr at 01/28/13 0044    Principal Problem:   Lung cancer Active Problems:   Dehydration   Hyponatremia   Hypotension   Thrombocytopenia   Time spent: 35  Pamella Pert, MD [facility] Pager  [phone]. If 7 PM - 7 AM, please contact night-coverage at [email or web address], password [facility] 01/28/2013, 8:47 AM  LOS: 1 day

## 2013-01-29 DIAGNOSIS — E43 Unspecified severe protein-calorie malnutrition: Secondary | ICD-10-CM

## 2013-01-29 DIAGNOSIS — D696 Thrombocytopenia, unspecified: Secondary | ICD-10-CM

## 2013-01-29 LAB — CBC
HCT: 23.7 % — ABNORMAL LOW (ref 39.0–52.0)
Hemoglobin: 8.7 g/dL — ABNORMAL LOW (ref 13.0–17.0)
MCH: 31.3 pg (ref 26.0–34.0)
MCHC: 36.7 g/dL — ABNORMAL HIGH (ref 30.0–36.0)
MCV: 85.3 fL (ref 78.0–100.0)
Platelets: 36 10*3/uL — ABNORMAL LOW (ref 150–400)
RBC: 2.78 MIL/uL — ABNORMAL LOW (ref 4.22–5.81)

## 2013-01-29 LAB — BASIC METABOLIC PANEL
BUN: 12 mg/dL (ref 6–23)
CO2: 22 mEq/L (ref 19–32)
Calcium: 7.6 mg/dL — ABNORMAL LOW (ref 8.4–10.5)
Glucose, Bld: 74 mg/dL (ref 70–99)
Potassium: 2.9 mEq/L — ABNORMAL LOW (ref 3.5–5.1)
Sodium: 130 mEq/L — ABNORMAL LOW (ref 135–145)

## 2013-01-29 LAB — MAGNESIUM: Magnesium: 1.8 mg/dL (ref 1.5–2.5)

## 2013-01-29 LAB — CLOSTRIDIUM DIFFICILE BY PCR: Toxigenic C. Difficile by PCR: NEGATIVE

## 2013-01-29 MED ORDER — BOOST / RESOURCE BREEZE PO LIQD
1.0000 | Freq: Three times a day (TID) | ORAL | Status: DC
  Administered 2013-01-30: 1 via ORAL

## 2013-01-29 MED ORDER — POTASSIUM CHLORIDE CRYS ER 20 MEQ PO TBCR
30.0000 meq | EXTENDED_RELEASE_TABLET | ORAL | Status: AC
  Administered 2013-01-29 (×3): 30 meq via ORAL
  Filled 2013-01-29 (×4): qty 1

## 2013-01-29 MED ORDER — PREDNISONE 20 MG PO TABS
20.0000 mg | ORAL_TABLET | Freq: Every day | ORAL | Status: DC
  Administered 2013-01-30: 20 mg via ORAL
  Filled 2013-01-29 (×2): qty 1

## 2013-01-29 NOTE — Progress Notes (Signed)
INITIAL NUTRITION ASSESSMENT  DOCUMENTATION CODES Per approved criteria  -Severe malnutrition in the context of chronic illness  Pt meets criteria for severe MALNUTRITION in the context of chronic illness as evidenced by >10% weight loss in 6 months and intake <75% of estimated needs for >1 month. INTERVENTION: - Resource Breeze po TID, each supplement provides 250 kcal and 9 grams of protein. - Once diet is advanced, please add Ensure Complete po BID, each supplement provides 350 kcal and 13 grams of protein.   NUTRITION DIAGNOSIS: Inadequate oral intake  related to cancer as evidenced by reported weight loss of 19 lbs in 4 months.   Goal: Pt to meet >/= 90% of their estimated nutrition needs   Monitor:  Weight, po intake, acceptance of supplements, labs  Reason for Assessment: MST  67 y.o. 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 Pt has completed multiple cycles of chemotherapy and is currently receiving Tarceva, which was initiated 1-2 days ago. He completed a course of palliative radiation on his spine on September 23. Pt was admitted with weakness and nausea x 3 days. Pt reported that he has started feeling better and that he drank all of his broth at breakfast. He reports a decrease in nausea. He reports that he weighed 132 lbs this past May. This reflects a 14% weight loss in 4-5 months. He says that he has not been eating well for the past several months. He would like to try Ensure Complete.   Height: Ht Readings from Last 1 Encounters:  01/27/13 5\' 10"  (1.778 m)    Weight: Wt Readings from Last 1 Encounters:  01/27/13 113 lb 15.7 oz (51.7 kg)    Ideal Body Weight: 73 kg  % Ideal Body Weight: 71%  Wt Readings from Last 10 Encounters:  01/27/13 113 lb 15.7 oz (51.7 kg)  01/24/13 119 lb 1.6 oz (54.023 kg)  01/23/13 117 lb 14.4 oz (53.479 kg)  01/17/13 120 lb 12.8  oz (54.795 kg)  01/10/13 124 lb 9.6 oz (56.518 kg)  01/04/13 129 lb 12.8 oz (58.877 kg)  01/04/13 129 lb 9.6 oz (58.786 kg)  12/29/12 134 lb 6.4 oz (60.963 kg)  12/14/12 138 lb 6.4 oz (62.778 kg)  12/08/12 143 lb (64.864 kg)    Usual Body Weight: 132 lbs in May, 2014  % Usual Body Weight: 86%  BMI:  Body mass index is 16.35 kg/(m^2).  Estimated Nutritional Needs: Kcal: 1500-1700 Protein: 70-80 g Fluid: >1.7 L  Skin: WNL  Diet Order: Clear Liquid  EDUCATION NEEDS: -Education needs addressed   Intake/Output Summary (Last 24 hours) at 01/29/13 1339 Last data filed at 01/29/13 1030  Gross per 24 hour  Intake   1725 ml  Output   1400 ml  Net    325 ml    Last BM: none recorded   Labs:   Recent Labs Lab 01/27/13 1241 01/28/13 0445 01/29/13 0450  NA 118* 123* 130*  K 3.7 3.7 2.9*  CL 80* 90* 99  CO2 24 21 22   BUN 20 14 12   CREATININE 0.62 0.52 0.53  CALCIUM 8.7 7.6* 7.6*  MG  --   --  1.8  GLUCOSE 123* 82 74    CBG (last 3)  No results found for this basename: GLUCAP,  in the last 72 hours  Scheduled Meds: . feeding supplement  237 mL Oral TID BM  . hydrocortisone  sod succinate (SOLU-CORTEF) inj  50 mg Intravenous Q12H  . pantoprazole  40 mg Oral QAC breakfast  . potassium chloride  30 mEq Oral Q2H  . sodium chloride  3 mL Intravenous Q12H    Continuous Infusions: . sodium chloride 75 mL/hr at 01/29/13 1157    Past Medical History  Diagnosis Date  . Hypertension   . PTSD (post-traumatic stress disorder)   . Chronic back pain   . Cancer   . Lung cancer     History reviewed. No pertinent past surgical history.  Ebbie Latus RD, LDN

## 2013-01-29 NOTE — Progress Notes (Signed)
[facility] PROGRESS NOTE  Billy Collier MVH:[id number] DOB: 05-27-1945 DOA: 01/27/2013 PCP: Provider Not In System  HPI:  Billy Collier is a 67 y.o. 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 He has completed multiple cycles of chemotherapy and is currently receiving Tarceva, which was initiated probably 1-2 days ago. He completed a course of palliative radiotherapy to his spine on September 23. He was in his usual state of health till about 3 days ago, when he started having weakness. He started having nausea without any vomiting. He couldn't eat or drink. Denies any abdominal pain. He has been having indigestion. He has had loose stools, but not profusely. Has been small quantity about once or twice a day. Has been dizzy, but denies any syncopal episodes. Denies any bleeding from anywhere. Denies any black stools. No fever, chills. No sick contacts. He's lost about 14 pounds in the last 5 weeks. He does get some shortness of breath with exertion, but none at rest. Denies any leg swelling. He is able to ambulate independently at this time. Not a very good historian. Patient appears to have expectations regarding his cancer.   Assessment/Plan: Dehydration with hypotension and hyponatremia:  - continue IVF, decrease rate this morning. Sodium improving.  - decrease steroids to oral prednisone.  Severe malnutrition - appreciate nutrition help.  Hyponatremia - due to #1, improving, continue hydration. Diarrhea: Could be related to the new chemotherapeutic agent Tarceva.  - C diff still pending since patient's diarrhea has resolved and could not get a stool sample  Metastatic lung cancer with metastases to the spine. Poor insight into his disease.  -He is completed radiotherapy just 2 days ago. He does not have any lower extremity weakness. He was started on Tarceva recently by his oncologist. He has completed multiple  courses of chemotherapy. His long-term prognosis is not clear. Pain medications as needed.  History of hypertension: We'll hold his antihypertensive medications due to his low blood pressure.  Thrombocytopenia: Most likely related to the cancer. Monitor platelet counts closely. Avoid anticoagulants.  Indigestion: This is a chronic issue for him. He'll be given Maalox and PPI. Abdomen is benign.  DVT prophylaxis - SCDs  Code Status: Full Family Communication: none  Disposition Plan: inpatient, home 1-2 days  Consultants:  none  Procedures:  none  HPI/Subjective: - endorses better energy levels today.   Objective: Filed Vitals:   01/28/13 0509 01/28/13 1502 01/28/13 2046 01/29/13 0535  BP: 99/64 106/70 109/70 100/66  Pulse: 97 88 92 86  Temp: 97.9 F (36.6 C) 98.6 F (37 C) 97.9 F (36.6 C) 97.7 F (36.5 C)  TempSrc: Oral Oral Oral Oral  Resp: 15 14 15 15   Height:      Weight:      SpO2: 100% 100% 100% 99%    Intake/Output Summary (Last 24 hours) at 01/29/13 0709 Last data filed at 01/29/13 0536  Gross per 24 hour  Intake   1845 ml  Output   1400 ml  Net    445 ml   Filed Weights   01/27/13 1156 01/27/13 1700  Weight: 54.432 kg (120 lb) 51.7 kg (113 lb 15.7 oz)    Exam:  General:  NAD, cachectic  Cardiovascular: regular rate and rhythm, without MRG  Respiratory: good air movement, clear to auscultation throughout, no wheezing, ronchi or rales  Abdomen: soft, not tender to palpation, positive bowel sounds  MSK: no peripheral edema  Neuro: CN 2-12 grossly intact, MS 5/5 in all 4  Data Reviewed: Basic Metabolic Panel:  Recent Labs Lab 01/27/13 1241 01/28/13 0445 01/29/13 0450  NA 118* 123* 130*  K 3.7 3.7 2.9*  CL 80* 90* 99  CO2 24 21 22   GLUCOSE 123* 82 74  BUN 20 14 12   CREATININE 0.62 0.52 0.53  CALCIUM 8.7 7.6* 7.6*   Liver Function Tests:  Recent Labs Lab 01/27/13 1241 01/28/13 0445  AST 36 32  ALT 53 39  ALKPHOS 246* 206*   BILITOT 0.8 0.5  PROT 5.7* 4.5*  ALBUMIN 2.8* 2.1*   No results found for this basename: LIPASE, AMYLASE,  in the last 168 hours No results found for this basename: AMMONIA,  in the last 168 hours CBC:  Recent Labs Lab 01/27/13 1241 01/28/13 0445 01/29/13 0450  WBC 8.5 6.2 4.8  NEUTROABS 7.9*  --   --   HGB 11.9* 9.6* 8.7*  HCT 32.0* 26.5* 23.7*  MCV 83.6 84.7 85.3  PLT 51* 41* 36*   Cardiac Enzymes: No results found for this basename: CKTOTAL, CKMB, CKMBINDEX, TROPONINI,  in the last 168 hours BNP (last 3 results) No results found for this basename: PROBNP,  in the last 8760 hours CBG: No results found for this basename: GLUCAP,  in the last 168 hours  Recent Results (from the past 240 hour(s))  TECHNOLOGIST REVIEW     Status: None   Collection Time    01/23/13  2:05 PM      Result Value Range Status   Technologist Review 2%nRBC, few targets   Final     Studies: Dg Chest Port 1 View  01/27/2013   CLINICAL DATA:  Weakness. History of lung carcinoma and hypertension.  EXAM: PORTABLE CHEST - 1 VIEW  COMPARISON:  Chest CT, 12/1912  FINDINGS: The cardiac silhouette is normal in size. The aorta is mildly uncoiled. There is scarring in the right upper lobe leading to hilar retraction superiorly. There is no convincing infiltrate. There is no pulmonary edema. No lung mass or nodule is seen. No pleural effusion or pneumothorax.  Mild compression fractures are noted of the lower thoracic vertebrae and L1 which were present on the prior CT. These are pathologic.  IMPRESSION: No acute findings.   Electronically Signed   By: Amie [place]   On: 01/27/2013 20:13    Scheduled Meds: . feeding supplement  237 mL Oral TID BM  . hydrocortisone sod succinate (SOLU-CORTEF) inj  50 mg Intravenous Q12H  . pantoprazole  40 mg Oral QAC breakfast  . potassium chloride  30 mEq Oral Q2H  . sodium chloride  3 mL Intravenous Q12H   Continuous Infusions: . sodium chloride 125 mL/hr at 01/28/13  1806    Principal Problem:   Lung cancer Active Problems:   Dehydration   Hyponatremia   Hypotension   Thrombocytopenia  Time spent: 25  Pamella Pert, MD [facility] Pager [phone]. If 7 PM - 7 AM, please contact night-coverage at [email or web address], password [email or web address] 01/29/2013, 7:09 AM  LOS: 2 days

## 2013-01-29 NOTE — Progress Notes (Signed)
Occupational Therapy Evaluation Patient Details Name: Billy Collier MRN: [id number] DOB: [birth year] Today's Date: 01/29/2013 Time: 1205-1219 OT Time Calculation (min): 14 min  OT Assessment / Plan / Recommendation History of present illness per H&P 9/26: " HPI: Billy Collier is a 67 y.o. 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 He has completed multiple cycles of chemotherapy and is currently receiving Tarceva, which was initiated probably 1-2 days ago. He completed a course of palliative radiotherapy to his spine on September 23. He was in his usual state of health till about 3 days ago, when he started having weakness. He started having nausea without any vomiting. He couldn't eat or drink. Denies any abdominal pain. He has been having indigestion. He has had loose stools, but not profusely. Has been small quantity about once or twice a day. Has been dizzy, but denies any syncopal episodes. Denies any bleeding from anywhere. Denies any black stools. No fever, chills. No sick contacts. He's lost about 14 pounds in the last 5 weeks. He does get some shortness of breath with exertion, but none at rest. Denies any leg swelling. He is able to ambulate independently at this time. Not a very good historian. Patient appears to have expectations regarding his cancer.   Clinical Impression   PTA, pt lived alone and sister helped with cooking cleaning, etc. Pt presents with generalized weakness and states that LB ADL are painful to complete at times. Pt will benefit from skilled OT services to educate pt on AE, compensatory techniques and E conservation for ADL. Will also implement theraband HEP. Pt states hs can live with his sister until he gets stronger to return home to live alone.     OT Assessment  Patient needs continued OT Services    Follow Up Recommendations  No OT follow up    Barriers to Discharge      Equipment  Recommendations  3 in 1 bedside comode    Recommendations for Other Services    Frequency  Min 2X/week    Precautions / Restrictions Precautions Precautions: Other (comment);Fall (enteric contact) Restrictions Weight Bearing Restrictions: No   Pertinent Vitals/Pain 5. Hips. meds given    ADL  Lower Body Bathing: Supervision/safety;Other (comment);Minimal assistance (with increased time) Where Assessed - Lower Body Bathing: Unsupported sit to stand Upper Body Dressing: Set up Where Assessed - Upper Body Dressing: Unsupported sitting Lower Body Dressing: Minimal assistance Where Assessed - Lower Body Dressing: Unsupported sit to stand Toilet Transfer: Supervision/safety Toilet Transfer Method: Other (comment) (ambulating) Acupuncturist: Comfort height toilet Toileting - Clothing Manipulation and Hygiene: Supervision/safety Where Assessed - Toileting Clothing Manipulation and Hygiene: Sit to stand from 3-in-1 or toilet Equipment Used: Gait belt Transfers/Ambulation Related to ADLs: S ADL Comments: States he fatigues easily and gets "worn out". States LB ADL is painful.    OT Diagnosis: Generalized weakness;Acute pain  OT Problem List: Decreased strength;Decreased activity tolerance;Decreased knowledge of use of DME or AE;Pain OT Treatment Interventions: Self-care/ADL training;Therapeutic exercise;Energy conservation;DME and/or AE instruction;Therapeutic activities;Patient/family education   OT Goals(Current goals can be found in the care plan section) Acute Rehab OT Goals Patient Stated Goal: to go home OT Goal Formulation: With patient Time For Goal Achievement: 02/05/13 Potential to Achieve Goals: Good  Visit Information  Last OT Received On: 01/29/13 Assistance Needed: +1 History of Present Illness: per H&P 9/26: " HPI: Billy Collier is a 66 y.o. 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  He has completed multiple cycles of chemotherapy and is currently receiving Tarceva, which was initiated probably 1-2 days ago. He completed a course of palliative radiotherapy to his spine on September 23. He was in his usual state of health till about 3 days ago, when he started having weakness. He started having nausea without any vomiting. He couldn't eat or drink. Denies any abdominal pain. He has been having indigestion. He has had loose stools, but not profusely. Has been small quantity about once or twice a day. Has been dizzy, but denies any syncopal episodes. Denies any bleeding from anywhere. Denies any black stools. No fever, chills. No sick contacts. He's lost about 14 pounds in the last 5 weeks. He does get some shortness of breath with exertion, but none at rest. Denies any leg swelling. He is able to ambulate independently at this time. Not a very good historian. Patient appears to have expectations regarding his cancer.       Prior Functioning     Home Living Family/patient expects to be discharged to:: Private residence Living Arrangements: Alone (family lives 5 minutes away) Available Help at Discharge: Family;Available PRN/intermittently Type of Home: House Home Access: Stairs to enter [employer] of Steps: 1 Entrance Stairs-Rails: None Home Layout: One level Home Equipment: None Prior Function Level of Independence: Independent (driving) Comments: sister did all IADL tasks Communication Communication: No difficulties         Vision/Perception     Cognition  Cognition Arousal/Alertness: Awake/alert Behavior During Therapy: WFL for tasks assessed/performed;Flat affect Overall Cognitive Status: Within Functional Limits for tasks assessed    Extremity/Trunk Assessment Upper Extremity Assessment Upper Extremity Assessment: Generalized weakness Lower Extremity Assessment Lower Extremity Assessment: Generalized weakness Cervical / Trunk  Assessment Cervical / Trunk Assessment: Other exceptions (c/o pain in hips)     Mobility Bed Mobility Bed Mobility: Supine to Sit;Sit to Supine Supine to Sit: 6: Modified independent (Device/Increase time);With rails Sit to Supine: 6: Modified independent (Device/Increase time);With rail Details for Bed Mobility Assistance: just moves very guraded and slow, but no assistance needed Transfers Sit to Stand: 5: Supervision Stand to Sit: 5: Supervision     Exercise     Balance  S   End of Session OT - End of Session Equipment Utilized During Treatment: Gait belt Activity Tolerance: Patient tolerated treatment well Patient left: in bed;with call bell/phone within reach;with bed alarm set Nurse Communication: Mobility status  GO     Donyea Gafford,HILLARY 01/29/2013, 12:24 PM [facility], OTR/L  [phone] 01/29/2013

## 2013-01-30 DIAGNOSIS — I10 Essential (primary) hypertension: Secondary | ICD-10-CM

## 2013-01-30 LAB — BASIC METABOLIC PANEL
BUN: 10 mg/dL (ref 6–23)
CO2: 19 mEq/L (ref 19–32)
Chloride: 100 mEq/L (ref 96–112)
GFR calc non Af Amer: >90 mL/min (ref >90)
Glucose, Bld: 51 mg/dL — ABNORMAL LOW (ref 70–99)
Potassium: 3.8 mEq/L (ref 3.5–5.1)

## 2013-01-30 LAB — CBC
HCT: 24.3 % — ABNORMAL LOW (ref 39.0–52.0)
Hemoglobin: 8.8 g/dL — ABNORMAL LOW (ref 13.0–17.0)
MCHC: 36.2 g/dL — ABNORMAL HIGH (ref 30.0–36.0)
MCV: 86.5 fL (ref 78.0–100.0)
RBC: 2.81 MIL/uL — ABNORMAL LOW (ref 4.22–5.81)

## 2013-01-30 NOTE — Discharge Summary (Addendum)
Physician Discharge Summary  Darrik Richman NWG:[id number] DOB: June 15, 1945 DOA: 01/27/2013  PCP: Provider Not In System  Admit date: 01/27/2013 Discharge date: 01/30/2013  Time spent: 40 minutes  Recommendations for Outpatient Follow-up:  1. Follow up with Dr. Arbutus Ped later this week   Recommendations for primary care physician for things to follow:  Repeat CBC, BMP  Discharge Diagnoses:  Principal Problem:   Lung cancer Active Problems:   Dehydration   Hyponatremia   Hypotension   Thrombocytopenia   Protein-calorie malnutrition, severe  Discharge Condition: stable  Diet recommendation: regular  Filed Weights   01/27/13 1156 01/27/13 1700  Weight: 54.432 kg (120 lb) 51.7 kg (113 lb 15.7 oz)   History of present illness:  Billy Collier is a 67 y.o. 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 He has completed multiple cycles of chemotherapy and is currently receiving Tarceva, which was initiated probably 1-2 days ago. He completed a course of palliative radiotherapy to his spine on September 23. He was in his usual state of health till about 3 days ago, when he started having weakness. He started having nausea without any vomiting. He couldn't eat or drink. Denies any abdominal pain. He has been having indigestion. He has had loose stools, but not profusely. Has been small quantity about once or twice a day. Has been dizzy, but denies any syncopal episodes. Denies any bleeding from anywhere. Denies any black stools. No fever, chills. No sick contacts. He's lost about 14 pounds in the last 5 weeks. He does get some shortness of breath with exertion, but none at rest. Denies any leg swelling. He is able to ambulate independently at this time. Not a very good historian. Patient appears to have expectations regarding his cancer.   Hospital Course:  Dehydration with hypotension and hyponatremia. Patient responded well  to IV hydration with normalization of his blood pressure and his energy levels. Initially he was started on IV steroids however he should resume his home decadron on discharge.  Severe malnutrition - continue to encourage po intake, he endorsed significant weight loss. Nutrition has been consulted in the hospital.  Hyponatremia - due to #1, improved with IV hydration. His sodium is stable and patient is eating and drinking well.  Diarrhea: Could be related to the new chemotherapeutic agent Tarceva vs self limiting non specific gastroenteritis. His C diff was negative and his diarrhea has completely resolved. Patient did not have abdominal pain, nausea and was tolerating a diet well.  Metastatic lung cancer with metastases to the spine. Poor insight into his disease. He is completed radiotherapy just 2 days ago. He does not have any lower extremity weakness. He was started on Tarceva recently by Dr. Arbutus Ped. He has completed multiple courses of chemotherapy.   History of hypertension: We'll hold his antihypertensive medications due to his low blood pressure. Discontinue antihypertensives on discharge. Advised to also follow up with his PCP for BP check in a week.  Thrombocytopenia/Anemia: Most likely related to the cancer, platelets are stable and with a trend upwards on discharge. I advised patient to follow up with his oncologist later this week for repeat CBC. He expressed understanding.  Indigestion: This is a chronic issue for him. On Maalox and PPI. Abdomen is benign.   Procedures:  none   Consultations:  none  Discharge Exam: Filed Vitals:   01/29/13 0535 01/29/13 1518 01/29/13 2100 01/30/13 0506  BP: 100/66 116/80 119/87 120/80  Pulse: 86 85 90 96  Temp: 97.7 F (36.5 C) 98.6 F (37 C) 97.6 F (36.4 C) 97.6 F (36.4 C)  TempSrc: Oral Oral Oral Oral  Resp: 15 16 16 14   Height:      Weight:      SpO2: 99% 100% 100% 100%   General: NAD Cardiovascular: RRR Respiratory: CTA  biL  Discharge Instructions       Future Appointments Provider Department Dept Phone   02/06/2013 1:15 PM Beverely Pace [facility] MEDICAL [facility]Y [phone]   02/06/2013 1:45 PM [facility] [phone]   02/23/2013 11:00 AM [name], MD [facility] RADIATION ONCOLOGY [phone]       Medication List    STOP taking these medications       lisinopril-hydrochlorothiazide 10-12.5 MG per tablet  Commonly known as:  PRINZIDE,ZESTORETIC      TAKE these medications       cyclobenzaprine 5 MG tablet  Commonly known as:  FLEXERIL  Take 1 tablet (5 mg total) by mouth 3 (three) times daily as needed for muscle spasms.     dexamethasone 4 MG tablet  Commonly known as:  DECADRON  - Take 2-8 mg by mouth as directed. Decadron Taper  - Starting today - 01/18/13   - 8 mg in AM, 8 mg at lunch, 4 mg at dinner for 7 days.   - 8 mg in AM, 8 mg at dinner for 7 days  - 8 mg in AM, 4 mg at dinner for 7 days  - 4 mg in AM, 4 mg at dinner for 7 days  - 4 mg in AM for 7 days  - 2 mg in AM for 7 days. Then Stop     diazepam 5 MG tablet  Commonly known as:  VALIUM  Take 1 tablet (5 mg total) by mouth every 8 (eight) hours as needed (muscle spasm).     ENSURE  Take 237 mLs by mouth 3 (three) times daily between meals.     erlotinib 150 MG tablet  Commonly known as:  TARCEVA  Take 1 tablet (150 mg total) by mouth daily. Take on an empty stomach 1 hour before meals or 2 hours after.     ibuprofen 600 MG tablet  Commonly known as:  ADVIL,MOTRIN  Take 1 tablet (600 mg total) by mouth every 6 (six) hours as needed for pain.     magic mouthwash w/lidocaine Soln  Take 15 mLs by mouth 3 (three) times daily as needed (before meals as needed).     magnesium oxide 400 MG tablet  Commonly known as:  MAG-OX  Take 400 mg by mouth 2 (two) times daily.     oxyCODONE-acetaminophen 5-325 MG per tablet   Commonly known as:  PERCOCET/ROXICET  Take 1-2 tablets by mouth every 4 (four) hours as needed for pain.     prochlorperazine 10 MG tablet  Commonly known as:  COMPAZINE  Take 10 mg by mouth every 6 (six) hours as needed (for nausea/vomiting).       Follow-up Information   Follow up with [facility] K., MD. Schedule an appointment as soon as possible for a visit in 3 days.   Specialty:  Oncology   Contact information:   [address] [place] [address] [phone]       The results of significant diagnostics from this hospitalization (including imaging, microbiology, ancillary and laboratory) are  listed below for reference.    Significant Diagnostic Studies: Dg Chest Port 1 View  01/27/2013   CLINICAL DATA:  Weakness. History of lung carcinoma and hypertension.  EXAM: PORTABLE CHEST - 1 VIEW  COMPARISON:  Chest CT, 12/1912  FINDINGS: The cardiac silhouette is normal in size. The aorta is mildly uncoiled. There is scarring in the right upper lobe leading to hilar retraction superiorly. There is no convincing infiltrate. There is no pulmonary edema. No lung mass or nodule is seen. No pleural effusion or pneumothorax.  Mild compression fractures are noted of the lower thoracic vertebrae and L1 which were present on the prior CT. These are pathologic.  IMPRESSION: No acute findings.   Electronically Signed   By: Amie Portland   On: 01/27/2013 20:13    Microbiology: Recent Results (from the past 240 hour(s))  TECHNOLOGIST REVIEW     Status: None   Collection Time    01/23/13  2:05 PM      Result Value Range Status   Technologist Review 2%nRBC, few targets   Final  CLOSTRIDIUM DIFFICILE BY PCR     Status: None   Collection Time    01/29/13  3:47 PM      Result Value Range Status   C difficile by pcr NEGATIVE  NEGATIVE Final   Comment: Performed at [facility]     Labs: Basic Metabolic Panel:  Recent Labs Lab 01/27/13 1241 01/28/13 0445 01/29/13 0450  01/30/13 0515  NA 118* 123* 130* 131*  K 3.7 3.7 2.9* 3.8  CL 80* 90* 99 100  CO2 24 21 22 19   GLUCOSE 123* 82 74 51*  BUN 20 14 12 10   CREATININE 0.62 0.52 0.53 0.50  CALCIUM 8.7 7.6* 7.6* 7.9*  MG  --   --  1.8  --    Liver Function Tests:  Recent Labs Lab 01/27/13 1241 01/28/13 0445  AST 36 32  ALT 53 39  ALKPHOS 246* 206*  BILITOT 0.8 0.5  PROT 5.7* 4.5*  ALBUMIN 2.8* 2.1*   CBC:  Recent Labs Lab 01/27/13 1241 01/28/13 0445 01/29/13 0450 01/30/13 0515  WBC 8.5 6.2 4.8 4.8  NEUTROABS 7.9*  --   --   --   HGB 11.9* 9.6* 8.7* 8.8*  HCT 32.0* 26.5* 23.7* 24.3*  MCV 83.6 84.7 85.3 86.5  PLT 51* 41* 36* 39*    Signed:  Cionna Collantes  [facility] 01/30/2013, 11:51 AM

## 2013-01-30 NOTE — Progress Notes (Signed)
Occupational Therapy Treatment Patient Details Name: Billy Collier MRN: [id number] DOB: 06-17-1945 Today's Date: 01/30/2013 Time: 1212-1224 OT Time Calculation (min): 12 min  OT Assessment / Plan / Recommendation  History of present illness per H&P 9/26: " HPI: Billy Collier is a 67 y.o. 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 He has completed multiple cycles of chemotherapy and is currently receiving Tarceva, which was initiated probably 1-2 days ago. He completed a course of palliative radiotherapy to his spine on September 23. He was in his usual state of health till about 3 days ago, when he started having weakness. He started having nausea without any vomiting. He couldn't eat or drink. Denies any abdominal pain. He has been having indigestion. He has had loose stools, but not profusely. Has been small quantity about once or twice a day. Has been dizzy, but denies any syncopal episodes. Denies any bleeding from anywhere. Denies any black stools. No fever, chills. No sick contacts. He's lost about 14 pounds in the last 5 weeks. He does get some shortness of breath with exertion, but none at rest. Denies any leg swelling. He is able to ambulate independently at this time. Not a very good historian. Patient appears to have expectations regarding his cancer.   OT comments  Pt needs a RW and and 3 n 1                    Plan Discharge plan remains appropriate    Precautions / Restrictions Precautions Precautions: Fall Restrictions Weight Bearing Restrictions: No       ADL  Lower Body Dressing: Performed;Supervision/safety Where Assessed - Lower Body Dressing: Unsupported sit to stand Toilet Transfer: Supervision/safety Toilet Transfer Method: Sit to stand Toilet Transfer Equipment: Comfort height toilet Toileting - Clothing Manipulation and Hygiene: Supervision/safety      OT Goals(current goals can now  be found in the care plan section) Acute Rehab OT Goals Patient Stated Goal: to get home today hopefully  Visit Information  Assistance Needed: +1 History of Present Illness: per H&P 9/26: " HPI: Billy Collier is a 67 y.o. 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 He has completed multiple cycles of chemotherapy and is currently receiving Tarceva, which was initiated probably 1-2 days ago. He completed a course of palliative radiotherapy to his spine on September 23. He was in his usual state of health till about 3 days ago, when he started having weakness. He started having nausea without any vomiting. He couldn't eat or drink. Denies any abdominal pain. He has been having indigestion. He has had loose stools, but not profusely. Has been small quantity about once or twice a day. Has been dizzy, but denies any syncopal episodes. Denies any bleeding from anywhere. Denies any black stools. No fever, chills. No sick contacts. He's lost about 14 pounds in the last 5 weeks. He does get some shortness of breath with exertion, but none at rest. Denies any leg swelling. He is able to ambulate independently at this time. Not a very good historian. Patient appears to have expectations regarding his cancer.          Cognition  Cognition Arousal/Alertness: Awake/alert Behavior During Therapy: WFL for tasks assessed/performed;Flat affect Overall Cognitive Status: Within Functional Limits for tasks assessed    Mobility  Bed Mobility Bed Mobility: Supine to Sit;Sit to Supine Supine  to Sit: 6: Modified independent (Device/Increase time);With rails Sit to Supine: 6: Modified independent (Device/Increase time);With rail Details for Bed Mobility Assistance: just moves very guraded and slow, but no assistance needed Transfers Sit to Stand: 5: Supervision Stand to Sit: 5: Supervision          End of Session OT - End of  Session Activity Tolerance: Patient tolerated treatment well Patient left: in chair  GO     Billy Collier, [place] Kung 01/30/2013, 12:24 PM

## 2013-02-03 ENCOUNTER — Encounter: Payer: Self-pay | Admitting: Radiation Oncology

## 2013-02-03 NOTE — Progress Notes (Signed)
  Radiation Oncology         [phone] ________________________________  Name: Billy Collier MRN: [id number]  Date: 02/03/2013  DOB: 1945-07-26   End of Treatment Note  Diagnosis:   Metastatic lung cancer     Indication for treatment:  Painful osseous metastasis in the thecal sac compression       Radiation treatment dates:   August 29 through September 23  Site/dose:   Lower thoracic and lumbar spine, 35 gray in 14 fractions  Beams/energy:   AP PA using 10 and 18  MV  photons  Narrative: The patient tolerated radiation treatment relatively well.   His pain  improved with his treatments.  He did have a poor appetite with his treatments.  Plan: The patient has completed radiation treatment. The patient will return to radiation oncology clinic for routine followup in one month. I advised them to call or return sooner if they have any questions or concerns related to their recovery or treatment.  -----------------------------------  [name], PhD, MD

## 2013-02-03 NOTE — Progress Notes (Signed)
  Radiation Oncology         [phone] ________________________________  Name: Billy Collier MRN: [id number]  Date: 02/03/2013  DOB: 04-09-1946  Simulation Verification Note - performed on 12/30/12  Status: outpatient  NARRATIVE: The patient was brought to the treatment unit and placed in the planned treatment position. The clinical setup was verified. Then port films were obtained and uploaded to the radiation oncology medical record software.  The treatment beams were carefully compared against the planned radiation fields. The position location and shape of the radiation fields was reviewed. They targeted volume of tissue appears to be appropriately covered by the radiation beams. Organs at risk appear to be excluded as planned.  Based on my personal review, I approved the simulation verification. The patient's treatment will proceed as planned.  -----------------------------------  [name], PhD, MD

## 2013-02-06 ENCOUNTER — Other Ambulatory Visit (HOSPITAL_BASED_OUTPATIENT_CLINIC_OR_DEPARTMENT_OTHER): Payer: Non-veteran care | Admitting: Lab

## 2013-02-06 ENCOUNTER — Ambulatory Visit: Payer: Non-veteran care | Admitting: Physician Assistant

## 2013-02-06 ENCOUNTER — Ambulatory Visit (HOSPITAL_BASED_OUTPATIENT_CLINIC_OR_DEPARTMENT_OTHER): Payer: Non-veteran care | Admitting: Physician Assistant

## 2013-02-06 ENCOUNTER — Telehealth: Payer: Self-pay | Admitting: Internal Medicine

## 2013-02-06 ENCOUNTER — Other Ambulatory Visit: Payer: Non-veteran care | Admitting: Lab

## 2013-02-06 DIAGNOSIS — C7951 Secondary malignant neoplasm of bone: Secondary | ICD-10-CM

## 2013-02-06 DIAGNOSIS — C3490 Malignant neoplasm of unspecified part of unspecified bronchus or lung: Secondary | ICD-10-CM

## 2013-02-06 DIAGNOSIS — D696 Thrombocytopenia, unspecified: Secondary | ICD-10-CM

## 2013-02-06 DIAGNOSIS — R630 Anorexia: Secondary | ICD-10-CM

## 2013-02-06 LAB — CBC WITH DIFFERENTIAL/PLATELET
Basophils Absolute: 0 10*3/uL (ref 0.0–0.1)
EOS%: 0.2 % (ref 0.0–7.0)
HCT: 30.5 % — ABNORMAL LOW (ref 38.4–49.9)
HGB: 11 g/dL — ABNORMAL LOW (ref 13.0–17.1)
MCH: 30.9 pg (ref 27.2–33.4)
MCV: 85.7 fL (ref 79.3–98.0)
MONO#: 0.6 10*3/uL (ref 0.1–0.9)
MONO%: 11.3 % (ref 0.0–14.0)
NEUT%: 85.6 % — ABNORMAL HIGH (ref 39.0–75.0)
nRBC: 5 % — ABNORMAL HIGH (ref 0–0)

## 2013-02-06 LAB — COMPREHENSIVE METABOLIC PANEL (CC13)
ALT: 27 U/L (ref 0–55)
AST: 24 U/L (ref 5–34)
Albumin: 2.6 g/dL — ABNORMAL LOW (ref 3.5–5.0)
Alkaline Phosphatase: 348 U/L — ABNORMAL HIGH (ref 40–150)
BUN: 14.2 mg/dL (ref 7.0–26.0)
Glucose: 147 mg/dl — ABNORMAL HIGH (ref 70–140)
Total Bilirubin: 0.41 mg/dL (ref 0.20–1.20)

## 2013-02-06 MED ORDER — METHYLPREDNISOLONE 4 MG PO KIT: PACK | ORAL | Status: AC

## 2013-02-06 NOTE — Telephone Encounter (Signed)
GV AND PRINTED APPT SCHED AND AVS FOR OPT FOR oct

## 2013-02-06 NOTE — Progress Notes (Addendum)
[facility] [phone]   Fax:(336) [phone]  SHARED VISIT PROGRESS NOTE  Provider Not In System No address on file  DIAGNOSIS: Metastatic non-small cell lung cancer, adenocarcinoma with negative EGFR mutation and negative ALK gene translocation as well as negative KRAS initially diagnosed as stage IIIB (T3, N3, M0) in June of 2012   PRIOR THERAPY:( done at the [facility], in [place], [place] ).  1) status post concurrent chemoradiation with cisplatin and etoposide for 2 cycles started November of 2012 and completed January of 2013.  2) status post 4 cycles of systemic chemotherapy was carboplatin, paclitaxel and Avastin for metastatic disease completed in 05/06/2012 with disease progression.  3) status post 2 cycles of maintenance Avastin 15 mg/kg, last dose was given 07/13/2012 discontinued secondary to disease progression.  4) status post 2 cycles of systemic chemotherapy with single agent Alimta 500 mg/M2 last dose was given in May of 2014 discontinued secondary to disease progression. 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 Roselind Messier. Last fraction of radiation on 01/24/2013.  CURRENT THERAPY: Tarceva 150 mg by mouth daily. Patient has not received this medication as yet.  CHEMOTHERAPY INTENT: Palliative  CURRENT # OF CHEMOTHERAPY CYCLES: 1  CURRENT ANTIEMETICS: Compazine  CURRENT SMOKING STATUS: Former smoker  ORAL CHEMOTHERAPY AND CONSENT: Tarceva and consent was signed 01/23/2013  CURRENT BISPHOSPHONATES USE: None  PAIN MANAGEMENT: 1/10 lower back  NARCOTICS INDUCED CONSTIPATION: None  LIVING WILL AND CODE STATUS: ?    INTERVAL HISTORY: Billy Collier 67 y.o. male returns to the clinic today for followup visit. Patient rate reports that his sister brought him however she is not in the exam room with this today.  He states that he was recently hospitalized for 4 days for dehydration and malnutrition. He states that they "put me on a liquid diet". He reports that he is hungry and wanted to eat food. He is drinking to ensure diet for supplements daily. He had an 8 this morning and had no issues with him been able to either eat or keep it down. He continues to have some loose should than usual stool. He also has had some nausea and vomiting particularly after drinking some 100% reduced. He has had issues with getting the Tarceva due to the fact that he is covered with the insurance and must establish with a [facility] first who must then authorize the Tarceva before can be ordered through the [place] system. The patient indicated that he may have some other insurance coverage by mid October 2014. He also believes that he is too weak to initiate any chemotherapy at this time, be it oral or IV. He has lost approximately 7 pounds since 01/24/2013.  He tolerated his palliative radiotherapy to the lower thoracic and upper lumbar spines fairly well with much improvement in the pain in this area. He denied having any significant chest pain, shortness of breath, cough or hemoptysis. The patient denied having any further episodes of nausea or vomiting.   MEDICAL HISTORY: Past Medical History  Diagnosis Date  . Hypertension   . PTSD (post-traumatic stress disorder)   . Chronic back pain   . Cancer   . Lung cancer     ALLERGIES:  has No Known Allergies.  MEDICATIONS:  Current Outpatient Prescriptions  Medication Sig Dispense Refill  . Alum & Mag Hydroxide-Simeth (MAGIC MOUTHWASH W/LIDOCAINE)  SOLN Take 15 mLs by mouth 3 (three) times daily as needed (before meals as needed).  480 mL  2  . cyclobenzaprine (FLEXERIL) 5 MG tablet Take 1 tablet (5 mg total) by mouth 3 (three) times daily as needed for muscle spasms.  30 tablet  0  . dexamethasone (DECADRON) 4 MG tablet Take 2-8 mg by mouth as directed. Decadron Taper Starting  today - 01/18/13  8 mg in AM, 8 mg at lunch, 4 mg at dinner for 7 days.  8 mg in AM, 8 mg at dinner for 7 days 8 mg in AM, 4 mg at dinner for 7 days 4 mg in AM, 4 mg at dinner for 7 days 4 mg in AM for 7 days 2 mg in AM for 7 days. Then Stop      . diazepam (VALIUM) 5 MG tablet Take 1 tablet (5 mg total) by mouth every 8 (eight) hours as needed (muscle spasm).  15 tablet  0  . ENSURE (ENSURE) Take 237 mLs by mouth 3 (three) times daily between meals.       . erlotinib (TARCEVA) 150 MG tablet Take 1 tablet (150 mg total) by mouth daily. Take on an empty stomach 1 hour before meals or 2 hours after.  30 tablet  2  . ibuprofen (ADVIL,MOTRIN) 600 MG tablet Take 1 tablet (600 mg total) by mouth every 6 (six) hours as needed for pain.  30 tablet  0  . magnesium oxide (MAG-OX) 400 MG tablet Take 400 mg by mouth 2 (two) times daily.      . methylPREDNISolone (MEDROL DOSEPAK) 4 MG tablet follow package directions  21 tablet  0  . oxyCODONE-acetaminophen (PERCOCET/ROXICET) 5-325 MG per tablet Take 1-2 tablets by mouth every 4 (four) hours as needed for pain.  90 tablet  0  . prochlorperazine (COMPAZINE) 10 MG tablet Take 10 mg by mouth every 6 (six) hours as needed (for nausea/vomiting).       No current facility-administered medications for this visit.    REVIEW OF SYSTEMS:  Constitutional: positive for anorexia, fatigue and weight loss Eyes: negative Ears, nose, mouth, throat, and face: negative Respiratory: negative Cardiovascular: negative Gastrointestinal: negative Genitourinary:negative Integument/breast: negative Hematologic/lymphatic: negative Musculoskeletal:positive for back pain Neurological: negative Behavioral/Psych: negative Endocrine: negative Allergic/Immunologic: negative   PHYSICAL EXAMINATION: General appearance: alert, cooperative and no distress Head: Normocephalic, without obvious abnormality, atraumatic Neck: no adenopathy, no carotid bruit, supple, symmetrical,  trachea midline and thyroid not enlarged, symmetric, no tenderness/mass/nodules Lymph nodes: Cervical, supraclavicular, and axillary nodes normal. Resp: clear to auscultation bilaterally and normal percussion bilaterally Back: symmetric, no curvature. ROM normal. No CVA tenderness. Cardio: regular rate and rhythm, S1, S2 normal, no murmur, click, rub or gallop GI: soft, non-tender; bowel sounds normal; no masses,  no organomegaly Extremities: extremities normal, atraumatic, no cyanosis or edema Neurologic: Alert and oriented X 3, normal strength and tone. Normal symmetric reflexes. Normal coordination and gait  ECOG PERFORMANCE STATUS: 1 - Symptomatic but completely ambulatory  Blood pressure 106/84, pulse 143, temperature 97.1 F (36.2 C), temperature source Oral, resp. rate 19, height 5\' 10"  (1.778 m), weight 112 lb 4.8 oz (50.939 kg).  LABORATORY DATA: Lab Results  Component Value Date   WBC 5.5 02/06/2013   HGB 11.0* 02/06/2013   HCT 30.5* 02/06/2013   MCV 85.7 02/06/2013   PLT 56* 02/06/2013      Chemistry      Component Value Date/Time   NA 133* 02/06/2013 1534  NA 131* 01/30/2013 0515   K 4.5 02/06/2013 1534   K 3.8 01/30/2013 0515   CL 100 01/30/2013 0515   CO2 23 02/06/2013 1534   CO2 19 01/30/2013 0515   BUN 14.2 02/06/2013 1534   BUN 10 01/30/2013 0515   CREATININE 0.7 02/06/2013 1534   CREATININE 0.50 01/30/2013 0515      Component Value Date/Time   CALCIUM 8.9 02/06/2013 1534   CALCIUM 7.9* 01/30/2013 0515   ALKPHOS 348* 02/06/2013 1534   ALKPHOS 206* 01/28/2013 0445   AST 24 02/06/2013 1534   AST 32 01/28/2013 0445   ALT 27 02/06/2013 1534   ALT 39 01/28/2013 0445   BILITOT 0.41 02/06/2013 1534   BILITOT 0.5 01/28/2013 0445       RADIOGRAPHIC STUDIES: Mr Thoracic Spine W Wo Contrast  12/29/2012   CLINICAL DATA:  67 year old male with metastatic lung cancer. Pathologic compression fractures in the lumbar spine with spinal stenosis. Severe back pain. New onset of lower  extremity numbness and difficulty walking.  EXAM: MRI THORACIC SPINE WITHOUT AND WITH CONTRAST  TECHNIQUE: Multiplanar and multiecho pulse sequences of the thoracic spine were obtained without and with intravenous contrast.  CONTRAST:  12mL MULTIHANCE GADOBENATE DIMEGLUMINE 529 MG/ML IV SOLN  COMPARISON:  Lumbar MRI 12/13/2012.  Chest CT 12/20/2012.  FINDINGS: Thoracic numbering agrees with the 12/13/2012 lumbar study. Limited sagittal imaging of the cervical spine suggests mild if any cervical osseous metastatic disease.  Advanced thoracic osseous metastases. Only of the T1 and T8 vertebral bodies appear to remain spared at this time. Multilevel posterior rib metastases. Multilevel thoracic posterior element metastases. There is a moderate pathologic compression fracture of T10 with diffuse vertebral involvement by tumor at that level and confluent medial right 10th rib involvement. There is mild epidural extension of tumor and mild retropulsion of the posterior vertebral body resulting in mild spinal stenosis at this level. No associated spinal cord signal abnormality.  In the upper lumbar spine epidural tumor is severe at L2, and today's post-contrast images demonstrate associated confluent abnormal dural thickening and enhancement tracking cephalad from the lumbar spine to the posterior T10 level. Subsequent moderate to severe spinal stenosis at the superior L2 level which appears to correspond to the conus medullaris. Still, no definite conus or lower thoracic spinal cord signal abnormality.  Normal thoracic spine thecal sac patency above the T10 level. Mild ventral epidural tumor at T11 without significant spinal stenosis. Mild circumferential epidural tumor at T12 with borderline to mild spinal stenosis. No abnormal intradural enhancement identified.  Abnormal lung parenchyma, appears stable since 12/20/12. Trace partially loculated right pleural effusion.  IMPRESSION: 1. 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 Today's post-contrast images demonstrate extension of dural tumor into the lower thoracic spine as far as the T10 level.  2. Widespread thoracic metastatic disease. T10 moderate pathologic compression fracture. 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  Study discussed by telephone with Dr. Welton Flakes on 12/29/2012 at 1508 hr.   Electronically Signed   By: Augusto Gamble   On: 12/29/2012 15:10    ASSESSMENT AND PLAN: This is a very pleasant 67 years old African American male with metastatic non-small cell lung cancer, adenocarcinoma status post several treatments in the past including concurrent chemoradiation with cisplatin and etoposide as well as carboplatin, paclitaxel and Avastin followed  by maintenance Avastin discontinued secondary to disease progression. The patient was then treated with single agent Alimta which again was discontinued secondary to disease progression and has been off treatment since May of 2014. He completed palliative radiotherapy to the metastatic bone disease in the lower thoracic and upper lumbar spines.The patient continues to have persistent thrombocytopenia. The patient was discussed with and also seen by Dr. Arbutus Ped. His difficulties in getting his Tarceva were also discussed. For appetite stimulation the patient will be placed on a Medrol Dosepak. He is encouraged to increase his by mouth intake. We'll have him come back for a another symptom management visit in approximately a week and a half. We will see Dr. Arbutus Ped we'll reevaluate him regarding the Tarceva therapy. He'll also discuss palliative care/hospice referral if indicated. The patient was advised to call immediately if he has any concerning symptoms in the interval.  Laural Benes, Zera Markwardt E, PA-C   The patient voices understanding of  current disease status and treatment options and is in agreement with the current care plan.  All questions were answered. The patient knows to call the clinic with any problems, questions or concerns. We can certainly see the patient much sooner if necessary.  ADDENDUM: Hematology/Oncology Attending: I had a face to face encounter with the patient. I recommended his care plan.this is a very pleasant 67 years old [place] American male with metastatic non-small cell lung cancer, adenocarcinoma status post several treatment regimens in the past including concurrent chemoradiation with cisplatin and etoposide as well as 4 cycles of chemotherapy was carboplatin, paclitaxel and Avastin, followed by maintenance Avastin followed by 2 cycles of chemotherapy with single agent Alimta discontinued secondary to disease progression. The patient also received palliative radiotherapy to the lower thoracic and upper lumbar spines. He was supposed to start treatment with Tarceva weeks ago but the patient was unable to get his medication and he does not want to start Tarceva until he has improvement in his general condition. He continues to have significant fatigue and weakness as well as decreased appetite and weight loss. We will consider the patient for Medrol Dosepak. Will consider referring the patient to the dietitian at the cancer Center for evaluation of his weight loss. The patient is not a good candidate for systemic chemotherapy because of the persistent thrombocytopenia. We will see him back for follow up visit in 2 weeks for reevaluation and discussion of his treatment options based on his lab results and performance status. Lajuana Matte., MD 02/07/2013

## 2013-02-07 ENCOUNTER — Encounter: Payer: Self-pay | Admitting: Physician Assistant

## 2013-02-07 NOTE — Patient Instructions (Addendum)
Take the Medrol Dosepak with food as prescribed to stimulate your appetite Followup with Dr. Arbutus Ped on 02/15/2013 as scheduled

## 2013-02-15 ENCOUNTER — Ambulatory Visit (HOSPITAL_BASED_OUTPATIENT_CLINIC_OR_DEPARTMENT_OTHER): Payer: Medicare Other | Admitting: Internal Medicine

## 2013-02-15 ENCOUNTER — Other Ambulatory Visit (HOSPITAL_BASED_OUTPATIENT_CLINIC_OR_DEPARTMENT_OTHER): Payer: Non-veteran care | Admitting: Lab

## 2013-02-15 ENCOUNTER — Telehealth: Payer: Self-pay | Admitting: Internal Medicine

## 2013-02-15 ENCOUNTER — Other Ambulatory Visit: Payer: Self-pay | Admitting: Medical Oncology

## 2013-02-15 ENCOUNTER — Encounter: Payer: Self-pay | Admitting: Internal Medicine

## 2013-02-15 DIAGNOSIS — C3490 Malignant neoplasm of unspecified part of unspecified bronchus or lung: Secondary | ICD-10-CM

## 2013-02-15 DIAGNOSIS — C7951 Secondary malignant neoplasm of bone: Secondary | ICD-10-CM

## 2013-02-15 LAB — CBC WITH DIFFERENTIAL/PLATELET
Basophils Absolute: 0 10*3/uL (ref 0.0–0.1)
EOS%: 0.1 % (ref 0.0–7.0)
Eosinophils Absolute: 0 10*3/uL (ref 0.0–0.5)
HCT: 26.1 % — ABNORMAL LOW (ref 38.4–49.9)
HGB: 9 g/dL — ABNORMAL LOW (ref 13.0–17.1)
MCV: 88.8 fL (ref 79.3–98.0)
MONO%: 10.4 % (ref 0.0–14.0)
NEUT#: 6.6 10*3/uL — ABNORMAL HIGH (ref 1.5–6.5)
NEUT%: 84.8 % — ABNORMAL HIGH (ref 39.0–75.0)
Platelets: 102 10*3/uL — ABNORMAL LOW (ref 140–400)
RDW: 21.9 % — ABNORMAL HIGH (ref 11.0–14.6)

## 2013-02-15 LAB — COMPREHENSIVE METABOLIC PANEL (CC13)
AST: 31 U/L (ref 5–34)
Albumin: 2.4 g/dL — ABNORMAL LOW (ref 3.5–5.0)
Anion Gap: 7 mEq/L (ref 3–11)
CO2: 26 mEq/L (ref 22–29)
Calcium: 8.5 mg/dL (ref 8.4–10.4)
Chloride: 101 mEq/L (ref 98–109)
Glucose: 157 mg/dl — ABNORMAL HIGH (ref 70–140)
Sodium: 134 mEq/L — ABNORMAL LOW (ref 136–145)
Total Bilirubin: 0.23 mg/dL (ref 0.20–1.20)
Total Protein: 5.4 g/dL — ABNORMAL LOW (ref 6.4–8.3)

## 2013-02-15 LAB — TECHNOLOGIST REVIEW

## 2013-02-15 MED ORDER — DRONABINOL 5 MG PO CAPS: 5.0000 mg | ORAL_CAPSULE | Freq: Two times a day (BID) | ORAL | Status: AC

## 2013-02-15 NOTE — Progress Notes (Signed)
[facility] Telephone:([phone]) [phone]   Fax:(336) [phone]  OFFICE PROGRESS NOTE  DIAGNOSIS: Metastatic non-small cell lung cancer, adenocarcinoma with negative EGFR mutation and negative ALK gene translocation as well as negative KRAS initially diagnosed as stage IIIB (T3, N3, M0) in June of 2012   PRIOR THERAPY:( done at the [facility], in [place], [place] ).  1) status post concurrent chemoradiation with cisplatin and etoposide for 2 cycles started November of 2012 and completed January of 2013.  2) status post 4 cycles of systemic chemotherapy was carboplatin, paclitaxel and Avastin for metastatic disease completed in 05/06/2012 with disease progression.  3) status post 2 cycles of maintenance Avastin 15 mg/kg, last dose was given 07/13/2012 discontinued secondary to disease progression.  4) status post 2 cycles of systemic chemotherapy with single agent Alimta 500 mg/M2 last dose was given in May of 2014 discontinued secondary to disease progression.  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 Roselind Messier. Last fraction of radiation on 01/24/2013.   CURRENT THERAPY: Tarceva 150 mg by mouth daily. Patient has not received this medication as yet.   CHEMOTHERAPY INTENT: Palliative  CURRENT # OF CHEMOTHERAPY CYCLES: 0  CURRENT ANTIEMETICS: Compazine  CURRENT SMOKING STATUS: Former smoker  ORAL CHEMOTHERAPY AND CONSENT: Tarceva and consent was signed 01/23/2013  CURRENT BISPHOSPHONATES USE: None  PAIN MANAGEMENT: 1/10 lower back  NARCOTICS INDUCED CONSTIPATION: None  LIVING WILL AND CODE STATUS: ?   INTERVAL HISTORY: Billy Collier 67 y.o. male returns to the clinic today for followup visit accompanied by his sister. The patient is feeling fine today except for the lack of appetite. He was tried on Medrol Dosepak with minimal improvement. He gained 2  pounds over the last 2 weeks. He would like to try some different medication to gain some weight before starting treatment with Tarceva. He denied having any significant chest pain, shortness breath, cough or hemoptysis. The patient denied having any nausea or vomiting. He denied having any significant back pain.  MEDICAL HISTORY: Past Medical History  Diagnosis Date  . Hypertension   . PTSD (post-traumatic stress disorder)   . Chronic back pain   . Cancer   . Lung cancer     ALLERGIES:  has No Known Allergies.  MEDICATIONS:  Current Outpatient Prescriptions  Medication Sig Dispense Refill  . cyclobenzaprine (FLEXERIL) 5 MG tablet Take 1 tablet (5 mg total) by mouth 3 (three) times daily as needed for muscle spasms.  30 tablet  0  . diazepam (VALIUM) 5 MG tablet Take 1 tablet (5 mg total) by mouth every 8 (eight) hours as needed (muscle spasm).  15 tablet  0  . ENSURE (ENSURE) Take 237 mLs by mouth 3 (three) times daily between meals.       Marland Kitchen ibuprofen (ADVIL,MOTRIN) 600 MG tablet Take 1 tablet (600 mg total) by mouth every 6 (six) hours as needed for pain.  30 tablet  0  . magnesium oxide (MAG-OX) 400 MG tablet Take 400 mg by mouth 2 (two) times daily.      Marland Kitchen oxyCODONE-acetaminophen (PERCOCET/ROXICET) 5-325 MG per tablet Take 1-2 tablets by mouth every 4 (four) hours as needed for pain.  90 tablet  0  . erlotinib (TARCEVA) 150 MG tablet Take 1 tablet (150 mg total) by mouth daily. Take on an empty stomach 1 hour before meals or 2 hours after.  30 tablet  2  . methylPREDNISolone (MEDROL DOSEPAK) 4 MG tablet follow package directions  21 tablet  0  . prochlorperazine (COMPAZINE) 10 MG tablet Take 10 mg by mouth every 6 (six) hours as needed (for nausea/vomiting).       No current facility-administered medications for this visit.    REVIEW OF SYSTEMS:  A comprehensive review of systems was negative except for: Constitutional: positive for anorexia and fatigue   PHYSICAL EXAMINATION:  General appearance: alert, cooperative, fatigued and no distress Head: Normocephalic, without obvious abnormality, atraumatic Neck: no adenopathy, no JVD, supple, symmetrical, trachea midline and thyroid not enlarged, symmetric, no tenderness/mass/nodules Lymph nodes: Cervical, supraclavicular, and axillary nodes normal. Resp: clear to auscultation bilaterally Cardio: regular rate and rhythm, S1, S2 normal, no murmur, click, rub or gallop GI: soft, non-tender; bowel sounds normal; no masses,  no organomegaly Extremities: extremities normal, atraumatic, no cyanosis or edema  ECOG PERFORMANCE STATUS: 1 - Symptomatic but completely ambulatory  Blood pressure 104/84, pulse 112, temperature 97.8 F (36.6 C), temperature source Oral, resp. rate 18, height 5\' 10"  (1.778 m), weight 114 lb 6.4 oz (51.891 kg).  LABORATORY DATA: Lab Results  Component Value Date   WBC 7.7 02/15/2013   HGB 9.0* 02/15/2013   HCT 26.1* 02/15/2013   MCV 88.8 02/15/2013   PLT 102* 02/15/2013      Chemistry      Component Value Date/Time   NA 134* 02/15/2013 1407   NA 131* 01/30/2013 0515   K 4.3 02/15/2013 1407   K 3.8 01/30/2013 0515   CL 100 01/30/2013 0515   CO2 26 02/15/2013 1407   CO2 19 01/30/2013 0515   BUN 15.4 02/15/2013 1407   BUN 10 01/30/2013 0515   CREATININE 0.7 02/15/2013 1407   CREATININE 0.50 01/30/2013 0515      Component Value Date/Time   CALCIUM 8.5 02/15/2013 1407   CALCIUM 7.9* 01/30/2013 0515   ALKPHOS 381* 02/15/2013 1407   ALKPHOS 206* 01/28/2013 0445   AST 31 02/15/2013 1407   AST 32 01/28/2013 0445   ALT 27 02/15/2013 1407   ALT 39 01/28/2013 0445   BILITOT 0.23 02/15/2013 1407   BILITOT 0.5 01/28/2013 0445       RADIOGRAPHIC STUDIES: Dg Chest Port 1 View  01/27/2013   CLINICAL DATA:  Weakness. History of lung carcinoma and hypertension.  EXAM: PORTABLE CHEST - 1 VIEW  COMPARISON:  Chest CT, 12/1912  FINDINGS: The cardiac silhouette is normal in size. The aorta is mildly  uncoiled. There is scarring in the right upper lobe leading to hilar retraction superiorly. There is no convincing infiltrate. There is no pulmonary edema. No lung mass or nodule is seen. No pleural effusion or pneumothorax.  Mild compression fractures are noted of the lower thoracic vertebrae and L1 which were present on the prior CT. These are pathologic.  IMPRESSION: No acute findings.   Electronically Signed   By: Amie Portland   On: 01/27/2013 20:13    ASSESSMENT AND PLAN: This is a very pleasant 67 years old African American male with metastatic non-small cell lung cancer most recently treated with palliative radiotherapy to the metastatic disease with cord compression in the lower thoracic and upper lumbar spines. I recommended for the patient treatment with Tarceva but he is reluctant to start the treatment until he has improvement in his general condition and gain some weight. I recommended for him treatment with Marinol 5 mg by mouth twice a day with to improve his  appetite and have some weight gain He is willing to start Tarceva at this point and he would go to [facility] outpatient pharmacy for refill of his medication. I would see him back for followup visit in 2 weeks for evaluation and management any adverse effect of his treatment. He was advised to call immediately if he has any concerning symptoms in the interval. The patient voices understanding of current disease status and treatment options and is in agreement with the current care plan.  All questions were answered. The patient knows to call the clinic with any problems, questions or concerns. We can certainly see the patient much sooner if necessary.

## 2013-02-15 NOTE — Patient Instructions (Signed)
Followup visit in 2 weeks. 

## 2013-02-15 NOTE — Telephone Encounter (Signed)
gave p[t appt for lab and ML on 10/30

## 2013-02-22 ENCOUNTER — Encounter: Payer: Self-pay | Admitting: Oncology

## 2013-02-23 ENCOUNTER — Ambulatory Visit
Admission: RE | Admit: 2013-02-23 | Discharge: 2013-02-23 | Disposition: A | Discharge status: Home / Self Care | Payer: Medicare Other | Source: Ambulatory Visit | Attending: Radiation Oncology | Admitting: Radiation Oncology

## 2013-02-23 ENCOUNTER — Encounter: Payer: Self-pay | Admitting: Radiation Oncology

## 2013-02-23 DIAGNOSIS — C3490 Malignant neoplasm of unspecified part of unspecified bronchus or lung: Secondary | ICD-10-CM

## 2013-02-23 MED ORDER — OXYCODONE-ACETAMINOPHEN 5-325 MG PO TABS: 1.0000 | ORAL_TABLET | ORAL | Status: AC | PRN

## 2013-02-23 NOTE — Progress Notes (Signed)
Billy Collier here with his wife for follow up after treatment to his thoracic and lumbar spine.  He is having pain due to bowel movements.  He is rating the pain at a 4/10.  He is having 4-5 bowel movements per day.  He says they are formed, not soft.  He also reports a poor appetite.  He says his taste buds are gone.  He has not lost weight since his last visit.  He does needs a refill on his percocet.  He is going to start the Tarceva today.  He does have fatigue and says he has trouble walking distances.  He denies nausea, sore throat and shortness of breath.  He does have a cough but is not bringing up blood.

## 2013-02-23 NOTE — Progress Notes (Signed)
Radiation Oncology         [phone] ________________________________  Name: Billy Collier MRN: [id number]  Date: 02/23/2013  DOB: May 23, 1945  Follow-Up Visit Note  CC: Provider Not In System  Si Gaul, MD  Diagnosis:   Metastatic non-small cell lung cancer  Interval Since Last Radiation:  1  months  Narrative:  The patient returns today for routine follow-up.  His back pain is much better. He does have some discomfort with bowel movements. He did refill his Percocet today. He is able to ambulate without the assistance of a walker or cane. He has been placed on Marinol for appetite stimulation.                                ALLERGIES:  has No Known Allergies.  Meds: Current Outpatient Prescriptions  Medication Sig Dispense Refill  . cyclobenzaprine (FLEXERIL) 5 MG tablet Take 1 tablet (5 mg total) by mouth 3 (three) times daily as needed for muscle spasms.  30 tablet  0  . diazepam (VALIUM) 5 MG tablet Take 1 tablet (5 mg total) by mouth every 8 (eight) hours as needed (muscle spasm).  15 tablet  0  . dronabinol (MARINOL) 5 MG capsule Take 1 capsule (5 mg total) by mouth 2 (two) times daily before a meal.  60 capsule  0  . ENSURE (ENSURE) Take 237 mLs by mouth 3 (three) times daily between meals.       . erlotinib (TARCEVA) 150 MG tablet Take 1 tablet (150 mg total) by mouth daily. Take on an empty stomach 1 hour before meals or 2 hours after.  30 tablet  2  . ibuprofen (ADVIL,MOTRIN) 600 MG tablet Take 1 tablet (600 mg total) by mouth every 6 (six) hours as needed for pain.  30 tablet  0  . magnesium oxide (MAG-OX) 400 MG tablet Take 400 mg by mouth 2 (two) times daily.      . methylPREDNISolone (MEDROL DOSEPAK) 4 MG tablet follow package directions  21 tablet  0  . oxyCODONE-acetaminophen (PERCOCET/ROXICET) 5-325 MG per tablet Take 1-2 tablets by mouth every 4 (four) hours as needed for pain.  90 tablet  0  . prochlorperazine (COMPAZINE) 10 MG tablet Take 10 mg by mouth  every 6 (six) hours as needed (for nausea/vomiting).       No current facility-administered medications for this encounter.    Physical Findings: The patient is in no acute distress. Patient is alert and oriented.  height is 5\' 10"  (1.778 m) and weight is 114 lb 14.4 oz (52.118 kg). His temperature is 98.1 F (36.7 C). His blood pressure is 103/73 and his pulse is 115. His oxygen saturation is 98%. .  Lower motor strength is 5 out of 5 in the proximal and distal muscle groups.  Lab Findings: Lab Results  Component Value Date   WBC 7.7 02/15/2013   HGB 9.0* 02/15/2013   HCT 26.1* 02/15/2013   MCV 88.8 02/15/2013   PLT 102* 02/15/2013      Radiographic Findings: Dg Chest Port 1 View  01/27/2013   CLINICAL DATA:  Weakness. History of lung carcinoma and hypertension.  EXAM: PORTABLE CHEST - 1 VIEW  COMPARISON:  Chest CT, 12/1912  FINDINGS: The cardiac silhouette is normal in size. The aorta is mildly uncoiled. There is scarring in the right upper lobe leading to hilar retraction superiorly. There is no convincing infiltrate. There is no  pulmonary edema. No lung mass or nodule is seen. No pleural effusion or pneumothorax.  Mild compression fractures are noted of the lower thoracic vertebrae and L1 which were present on the prior CT. These are pathologic.  IMPRESSION: No acute findings.   Electronically Signed   By: Amie Portland   On: 01/27/2013 20:13    Impression:  The patient is recovering from the effects of radiation. Pain is much better after his palliative radiation therapy.  Neurologically he remains intact.  Plan:  Routine followup in 3 months. The patient will begin Tarceva this week and is scheduled pick this medication up at the outpatient pharmacy.  _____________________________________  -----------------------------------  [name], PhD, MD

## 2013-03-02 ENCOUNTER — Ambulatory Visit (HOSPITAL_BASED_OUTPATIENT_CLINIC_OR_DEPARTMENT_OTHER): Payer: Medicare Other | Admitting: Physician Assistant

## 2013-03-02 ENCOUNTER — Telehealth: Payer: Self-pay | Admitting: Internal Medicine

## 2013-03-02 ENCOUNTER — Other Ambulatory Visit (HOSPITAL_BASED_OUTPATIENT_CLINIC_OR_DEPARTMENT_OTHER): Payer: Medicare Other | Admitting: Lab

## 2013-03-02 DIAGNOSIS — C7951 Secondary malignant neoplasm of bone: Secondary | ICD-10-CM

## 2013-03-02 DIAGNOSIS — R630 Anorexia: Secondary | ICD-10-CM

## 2013-03-02 DIAGNOSIS — C3490 Malignant neoplasm of unspecified part of unspecified bronchus or lung: Secondary | ICD-10-CM

## 2013-03-02 DIAGNOSIS — R5381 Other malaise: Secondary | ICD-10-CM

## 2013-03-02 LAB — COMPREHENSIVE METABOLIC PANEL (CC13)
AST: 51 U/L — ABNORMAL HIGH (ref 5–34)
Albumin: 2.5 g/dL — ABNORMAL LOW (ref 3.5–5.0)
Alkaline Phosphatase: 431 U/L — ABNORMAL HIGH (ref 40–150)
BUN: 20.4 mg/dL (ref 7.0–26.0)
Calcium: 9.4 mg/dL (ref 8.4–10.4)
Potassium: 4.2 mEq/L (ref 3.5–5.1)
Sodium: 139 mEq/L (ref 136–145)
Total Bilirubin: 0.74 mg/dL (ref 0.20–1.20)
Total Protein: 5.9 g/dL — ABNORMAL LOW (ref 6.4–8.3)

## 2013-03-02 LAB — CBC WITH DIFFERENTIAL/PLATELET
BASO%: 0.5 % (ref 0.0–2.0)
EOS%: 0.1 % (ref 0.0–7.0)
Eosinophils Absolute: 0 10*3/uL (ref 0.0–0.5)
HGB: 8.3 g/dL — ABNORMAL LOW (ref 13.0–17.1)
LYMPH%: 4.5 % — ABNORMAL LOW (ref 14.0–49.0)
MCH: 30.6 pg (ref 27.2–33.4)
MCHC: 33.3 g/dL (ref 32.0–36.0)
MONO%: 11.7 % (ref 0.0–14.0)
NEUT#: 10.5 10*3/uL — ABNORMAL HIGH (ref 1.5–6.5)
RBC: 2.71 10*6/uL — ABNORMAL LOW (ref 4.20–5.82)
RDW: 24.9 % — ABNORMAL HIGH (ref 11.0–14.6)
lymph#: 0.6 10*3/uL — ABNORMAL LOW (ref 0.9–3.3)
nRBC: 10 % — ABNORMAL HIGH (ref 0–0)

## 2013-03-02 NOTE — Telephone Encounter (Signed)
gave pt appt for November 2014 lab and MD

## 2013-03-06 NOTE — Patient Instructions (Signed)
Continue taking Tarceva 150 mg by mouth daily Increase your intake of food and fluids Follow up in 2 weeks

## 2013-03-06 NOTE — Progress Notes (Addendum)
Cancer Center Telephone:([phone]) [phone]   Fax:([phone]) [phone]  SHARED VISIT PROGRESS NOTE  DIAGNOSIS: Metastatic non-small cell lung cancer, adenocarcinoma with negative EGFR mutation and negative ALK gene translocation as well as negative KRAS initially diagnosed as stage IIIB (T3, N3, M0) in June of 2012   PRIOR THERAPY:( done at the [facility], in [place], [place] ).  1) status post concurrent chemoradiation with cisplatin and etoposide for 2 cycles started November of 2012 and completed January of 2013.  2) status post 4 cycles of systemic chemotherapy was carboplatin, paclitaxel and Avastin for metastatic disease completed in 05/06/2012 with disease progression.  3) status post 2 cycles of maintenance Avastin 15 mg/kg, last dose was given 07/13/2012 discontinued secondary to disease progression.  4) status post 2 cycles of systemic chemotherapy with single agent Alimta 500 mg/M2 last dose was given in May of 2014 discontinued secondary to disease progression.  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 Roselind Messier. Last fraction of radiation on 01/24/2013.   CURRENT THERAPY: Tarceva 150 mg by mouth daily. Therapy starting 02/24/2013   CHEMOTHERAPY INTENT: Palliative  CURRENT # OF CHEMOTHERAPY CYCLES: 0  CURRENT ANTIEMETICS: Compazine  CURRENT SMOKING STATUS: Former smoker  ORAL CHEMOTHERAPY AND CONSENT: Tarceva and consent was signed 01/23/2013  CURRENT BISPHOSPHONATES USE: None  PAIN MANAGEMENT: 1/10 lower back  NARCOTICS INDUCED CONSTIPATION: None  LIVING WILL AND CODE STATUS: ?   INTERVAL HISTORY: Billy Collier 67 y.o. male returns to the clinic today for followup visit accompanied by his sister. The patient is feeling fine today except for the lack of appetite. He reports that he completed his radiation therapy. He complains of feeling tired  and worn out. His weight is down another 3 pounds. He reports that he drinks 3 ensure supplements daily as well as heat some chicken noodle soup. He is tolerating the Tarceva relatively well thus far. He denies any problems with skin rash. He alternates with diarrhea and constipation but this is baseline for him. He voiced no other specific complaints today.  He denied having any significant chest pain, shortness breath, cough or hemoptysis. The patient denied having any nausea or vomiting. He denied having any significant back pain.  MEDICAL HISTORY: Past Medical History  Diagnosis Date  . Hypertension   . PTSD (post-traumatic stress disorder)   . Chronic back pain   . Cancer   . Lung cancer   . History of radiation therapy 12/30/2012-01/24/2013    lower thoracic and lumbar spine 35 gray    ALLERGIES:  has No Known Allergies.  MEDICATIONS:  Current Outpatient Prescriptions  Medication Sig Dispense Refill  . cyclobenzaprine (FLEXERIL) 5 MG tablet Take 1 tablet (5 mg total) by mouth 3 (three) times daily as needed for muscle spasms.  30 tablet  0  . diazepam (VALIUM) 5 MG tablet Take 1 tablet (5 mg total) by mouth every 8 (eight) hours as needed (muscle spasm).  15 tablet  0  . dronabinol (MARINOL) 5 MG capsule Take 1 capsule (5 mg total) by mouth 2 (two) times daily before a meal.  60 capsule  0  . ENSURE (ENSURE) Take 237 mLs by mouth 3 (three) times daily between meals.       . erlotinib (TARCEVA) 150 MG tablet Take 1 tablet (150 mg total) by mouth daily. Take on an empty stomach 1 hour  before meals or 2 hours after.  30 tablet  2  . methylPREDNISolone (MEDROL DOSEPAK) 4 MG tablet follow package directions  21 tablet  0  . oxyCODONE-acetaminophen (PERCOCET/ROXICET) 5-325 MG per tablet Take 1-2 tablets by mouth every 4 (four) hours as needed for pain.  90 tablet  0  . prochlorperazine (COMPAZINE) 10 MG tablet Take 10 mg by mouth every 6 (six) hours as needed (for nausea/vomiting).      Marland Kitchen  ibuprofen (ADVIL,MOTRIN) 600 MG tablet Take 1 tablet (600 mg total) by mouth every 6 (six) hours as needed for pain.  30 tablet  0  . magnesium oxide (MAG-OX) 400 MG tablet Take 400 mg by mouth 2 (two) times daily.       No current facility-administered medications for this visit.    REVIEW OF SYSTEMS:  A comprehensive review of systems was negative except for: Constitutional: positive for anorexia, fatigue and weight loss   PHYSICAL EXAMINATION: General appearance: alert, cooperative, fatigued and no distress Head: Normocephalic, without obvious abnormality, atraumatic Neck: no adenopathy, no JVD, supple, symmetrical, trachea midline and thyroid not enlarged, symmetric, no tenderness/mass/nodules Lymph nodes: Cervical, supraclavicular, and axillary nodes normal. Resp: clear to auscultation bilaterally Cardio: regular rate and rhythm, S1, S2 normal, no murmur, click, rub or gallop GI: soft, non-tender; bowel sounds normal; no masses,  no organomegaly Extremities: extremities normal, atraumatic, no cyanosis or edema  ECOG PERFORMANCE STATUS: 1 - Symptomatic but completely ambulatory  Blood pressure 103/71, pulse 119, temperature 96.8 F (36 C), temperature source Oral, resp. rate 18, height 5\' 10"  (1.778 m), weight 111 lb 14.4 oz (50.758 kg).  LABORATORY DATA: Lab Results  Component Value Date   WBC 12.6* 03/02/2013   HGB 8.3* 03/02/2013   HCT 24.9* 03/02/2013   MCV 91.9 03/02/2013   PLT 126* 03/02/2013      Chemistry      Component Value Date/Time   NA 139 03/02/2013 1047   NA 131* 01/30/2013 0515   K 4.2 03/02/2013 1047   K 3.8 01/30/2013 0515   CL 100 01/30/2013 0515   CO2 23 03/02/2013 1047   CO2 19 01/30/2013 0515   BUN 20.4 03/02/2013 1047   BUN 10 01/30/2013 0515   CREATININE 0.7 03/02/2013 1047   CREATININE 0.50 01/30/2013 0515      Component Value Date/Time   CALCIUM 9.4 03/02/2013 1047   CALCIUM 7.9* 01/30/2013 0515   ALKPHOS 431* 03/02/2013 1047   ALKPHOS 206*  01/28/2013 0445   AST 51* 03/02/2013 1047   AST 32 01/28/2013 0445   ALT 22 03/02/2013 1047   ALT 39 01/28/2013 0445   BILITOT 0.74 03/02/2013 1047   BILITOT 0.5 01/28/2013 0445       RADIOGRAPHIC STUDIES: Dg Chest Port 1 View  01/27/2013   CLINICAL DATA:  Weakness. History of lung carcinoma and hypertension.  EXAM: PORTABLE CHEST - 1 VIEW  COMPARISON:  Chest CT, 12/1912  FINDINGS: The cardiac silhouette is normal in size. The aorta is mildly uncoiled. There is scarring in the right upper lobe leading to hilar retraction superiorly. There is no convincing infiltrate. There is no pulmonary edema. No lung mass or nodule is seen. No pleural effusion or pneumothorax.  Mild compression fractures are noted of the lower thoracic vertebrae and L1 which were present on the prior CT. These are pathologic.  IMPRESSION: No acute findings.   Electronically Signed   By: Amie Portland   On: 01/27/2013 20:13    ASSESSMENT AND PLAN:  This is a very pleasant 67 years old [place] American male with metastatic non-small cell lung cancer most recently treated with palliative radiotherapy to the metastatic disease with cord compression in the lower thoracic and upper lumbar spines. Patient is currently being treated with Tarceva 150 mg by mouth daily, therapy beginning 02/24/2013. Patient was discussed with an also seen by Dr. Arbutus Ped. You will continue on Tarceva at 150 mg by mouth daily. He was encouraged to increase his by mouth intake of both food and fluids. He will return in 2 weeks for another symptom management visit with a repeat CBC differential and C. met.  Laural Benes, Kathryn Linarez E, PA-C   He was advised to call immediately if he has any concerning symptoms in the interval. The patient voices understanding of current disease status and treatment options and is in agreement with the current care plan.  All questions were answered. The patient knows to call the clinic with any problems, questions or concerns. We can  certainly see the patient much sooner if necessary.  ADDENDUM: Hematology/Oncology Attending: I had a face to face encounter with the patient. I recommended his care plan. He has metastatic non-small cell lung cancer, adenocarcinoma and recently started on treatment with Tarceva 150 mg by mouth daily status post 7 days of treatment. The patient and is tolerating his treatment with Tarceva fairly well so far. He denied having any significant skin rash or diarrhea. He still complaining of fatigue and lack of appetite. I recommended for the patient to continue his current treatment with Tarceva and he would come back for follow up visit in 2 weeks for reevaluation. He was advised to call immediately if he has any concerning symptoms in the interval. Lajuana Matte., MD 03/06/2013

## 2013-03-16 ENCOUNTER — Other Ambulatory Visit: Payer: Non-veteran care | Admitting: Lab

## 2013-03-16 ENCOUNTER — Ambulatory Visit: Payer: Non-veteran care | Admitting: Physician Assistant

## 2013-03-17 ENCOUNTER — Other Ambulatory Visit: Payer: Self-pay | Admitting: Physician Assistant

## 2013-03-20 ENCOUNTER — Telehealth: Payer: Self-pay | Admitting: Internal Medicine

## 2013-03-31 ENCOUNTER — Telehealth: Payer: Self-pay | Admitting: *Deleted

## 2013-03-31 NOTE — Telephone Encounter (Signed)
Pt's sister Verlee Rossetti requested a letter stating that she assisted pt and drove him to his appts.  Letter written, signed by Dr Donnald Garre, and mailed to Sara's home address per her request.  Called and informed her it has been mailed.  SLJ

## 2013-04-03 ENCOUNTER — Telehealth: Payer: Self-pay | Admitting: Dietician

## 2013-04-03 NOTE — Telephone Encounter (Signed)
Brief Outpatient Oncology Nutrition Note  Patient has been identified to be at risk on malnutrition screen.  Wt Readings from Last 10 Encounters:  03/02/13 111 lb 14.4 oz (50.758 kg)  02/23/13 114 lb 14.4 oz (52.118 kg)  02/15/13 114 lb 6.4 oz (51.891 kg)  02/06/13 112 lb 4.8 oz (50.939 kg)  01/27/13 113 lb 15.7 oz (51.7 kg)  01/24/13 119 lb 1.6 oz (54.023 kg)  01/23/13 117 lb 14.4 oz (53.479 kg)  01/17/13 120 lb 12.8 oz (54.795 kg)  01/10/13 124 lb 9.6 oz (56.518 kg)  01/04/13 129 lb 12.8 oz (58.877 kg)    DIAGNOSIS: Metastatic non-small cell lung cancer, adenocarcinoma with negative EGFR mutation and negative ALK gene translocation as well as negative KRAS initially diagnosed as stage IIIB (T3, N3, M0) in June of 2012 -Last fraction of palliative radiotherapy 01/24/13.    Called patient due to continued weight loss.  Patient not available and his message mailbox is full.    [facility] is available as needed.  Oran Rein, RD, LDN Clinical Inpatient Dietitian Pager:  [phone] Weekend and after hours pager:  [phone]

## 2013-04-03 NOTE — Telephone Encounter (Signed)
Faxed pt medical records to [facility] of [facility]

## 2013-04-03 DEATH — deceased

## 2013-04-04 ENCOUNTER — Encounter: Payer: Self-pay | Admitting: Radiation Oncology

## 2013-04-05 ENCOUNTER — Telehealth: Payer: Self-pay | Admitting: Radiation Oncology

## 2013-04-05 NOTE — Telephone Encounter (Signed)
Mailed letter to Billy Collier, [address] [address], [place], [place]  [address].

## 2013-04-19 ENCOUNTER — Telehealth: Payer: Self-pay | Admitting: Dietician

## 2013-04-19 NOTE — Telephone Encounter (Signed)
Brief Outpatient Oncology Nutrition Note  Patient has been identified to be at risk on malnutrition screen.  Wt Readings from Last 10 Encounters:  03/02/13 111 lb 14.4 oz (50.758 kg)  02/23/13 114 lb 14.4 oz (52.118 kg)  02/15/13 114 lb 6.4 oz (51.891 kg)  02/06/13 112 lb 4.8 oz (50.939 kg)  01/27/13 113 lb 15.7 oz (51.7 kg)  01/24/13 119 lb 1.6 oz (54.023 kg)  01/23/13 117 lb 14.4 oz (53.479 kg)  01/17/13 120 lb 12.8 oz (54.795 kg)  01/10/13 124 lb 9.6 oz (56.518 kg)  01/04/13 129 lb 12.8 oz (58.877 kg)    DIAGNOSIS: Metastatic non-small cell lung cancer, adenocarcinoma with negative EGFR mutation and negative ALK gene translocation as well as negative KRAS initially diagnosed as stage IIIB (T3, N3, M0) in June of 2012 -Last fraction of palliative radiotherapy 01/24/13.   Called patient again due to decreased weight.  Patient is not available and message mailbox is full.   [facility] is available as needed.  Oran Rein, RD, LDN

## 2013-05-08 ENCOUNTER — Telehealth: Payer: Self-pay | Admitting: Dietician

## 2013-05-08 NOTE — Telephone Encounter (Signed)
Brief Outpatient Oncology Nutrition Note  Patient has been identified to be at risk on malnutrition screen.  Wt Readings from Last 10 Encounters:  03/02/13 111 lb 14.4 oz (50.758 kg)  02/23/13 114 lb 14.4 oz (52.118 kg)  02/15/13 114 lb 6.4 oz (51.891 kg)  02/06/13 112 lb 4.8 oz (50.939 kg)  01/27/13 113 lb 15.7 oz (51.7 kg)  01/24/13 119 lb 1.6 oz (54.023 kg)  01/23/13 117 lb 14.4 oz (53.479 kg)  01/17/13 120 lb 12.8 oz (54.795 kg)  01/10/13 124 lb 9.6 oz (56.518 kg)  01/04/13 129 lb 12.8 oz (58.877 kg)   DIAGNOSIS: Metastatic non-small cell lung cancer, adenocarcinoma with negative EGFR mutation and negative ALK gene translocation as well as negative KRAS initially diagnosed as stage IIIB (T3, N3, M0) in June of 2012 -Last fraction of palliative radiotherapy 01/24/13.    Called again due to decreased weight.  Patient's number is no longer in service.  [place] RD available as needed.  Antonieta Iba, RD, LDN

## 2013-05-16 ENCOUNTER — Encounter: Payer: Self-pay | Admitting: Internal Medicine

## 2013-05-16 NOTE — Progress Notes (Signed)
Put [employer] form on nurse's desk.

## 2013-05-18 ENCOUNTER — Encounter: Payer: Self-pay | Admitting: Internal Medicine

## 2013-05-18 ENCOUNTER — Ambulatory Visit: Payer: Non-veteran care | Admitting: Radiation Oncology

## 2013-05-18 NOTE — Progress Notes (Signed)
Mailed [employer] form to [place] [address] [place] [address].

## 2013-06-02 ENCOUNTER — Encounter: Payer: Self-pay | Admitting: *Deleted

## 2013-06-02 NOTE — Progress Notes (Signed)
[facility] Psychosocial Distress Screening Clinical Social Work  Clinical Social Work was referred by distress screening protocol.  The patient scored a 8 on the Psychosocial Distress Thermometer which indicates severe distress. Pt also checked a 3 and a 2, so CSW not sure if the 8 is reliable and was not called by RN at time of completion.  Clinical Social Worker attempted to phone Pt to assess for distress and other psychosocial needs, however Pt's phone does a fast busy and CSW was unable to leave a message.   Clinical Social Worker follow up needed: yes  If yes, follow up plan: CSW will try to check on Pt at a future appointment, but none made currently.   Loren Racer, LCSW Clinical Social Worker [facility] Wednesday, Thursday and Friday Phone: [phone] Fax: [phone]

## 2015-07-08 IMAGING — CR DG LUMBAR SPINE COMPLETE 4+V
5 series · 5 of 5 positions shown · non-contrast
Comparison: None

CLINICAL DATA: Back pain.  Lung cancer.

LUMBAR SPINE - COMPLETE 4+ VIEW

[view not recorded (1 of 5)]
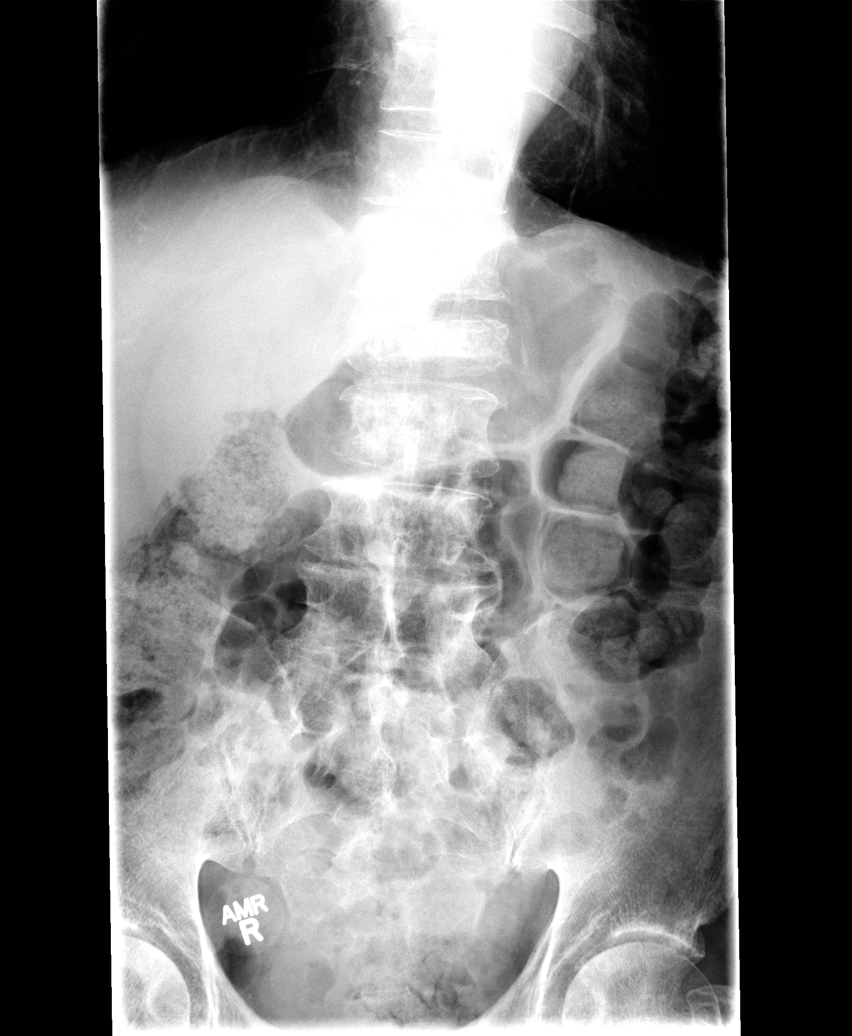

[view not recorded (2 of 5)]
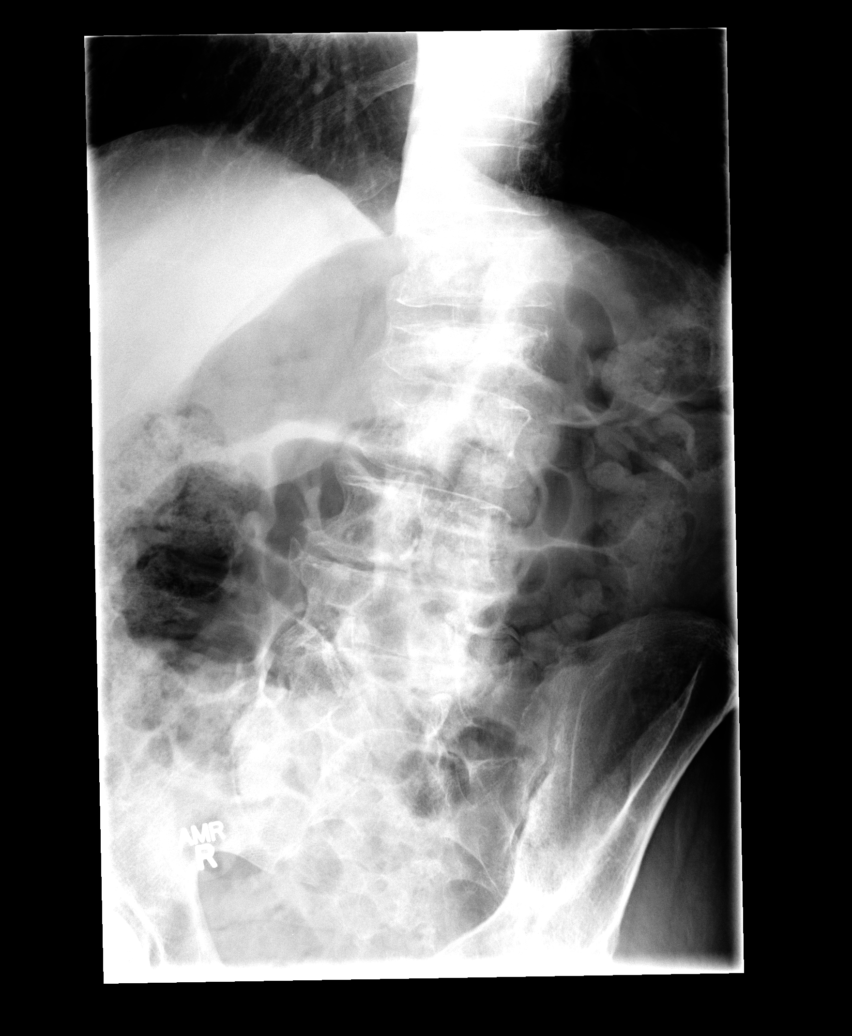

[view not recorded (3 of 5)]
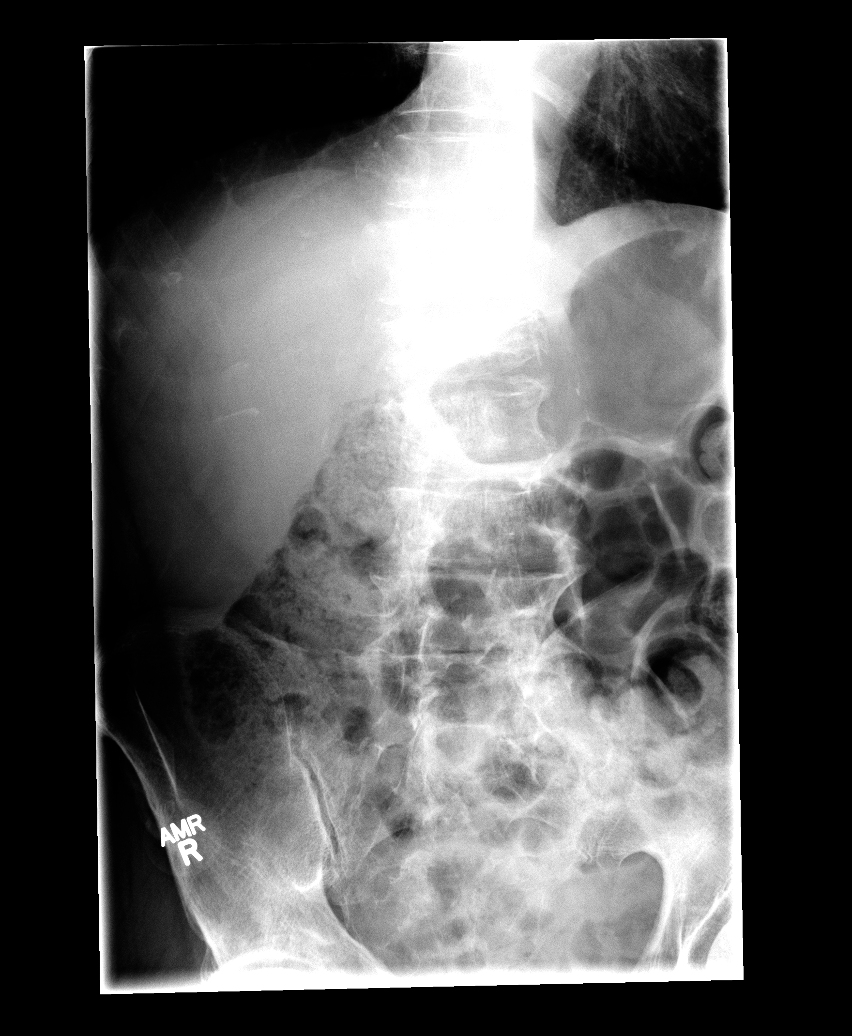

[view not recorded (4 of 5)]
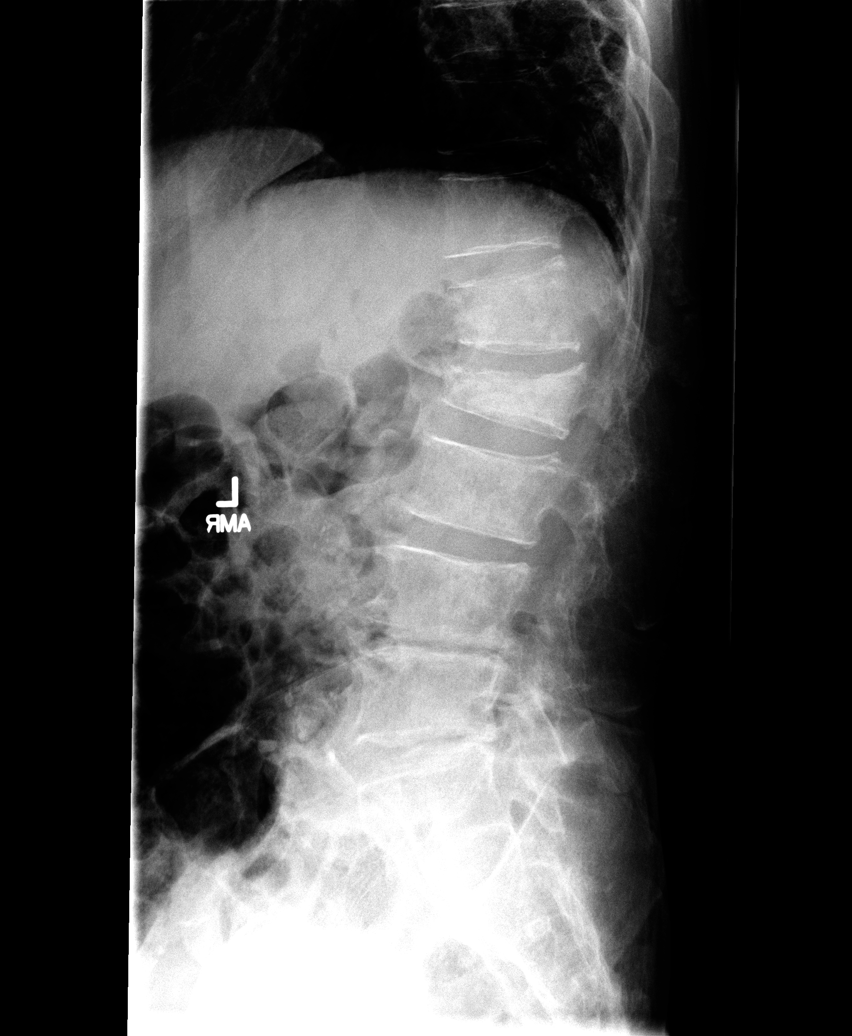

[view not recorded (5 of 5)]
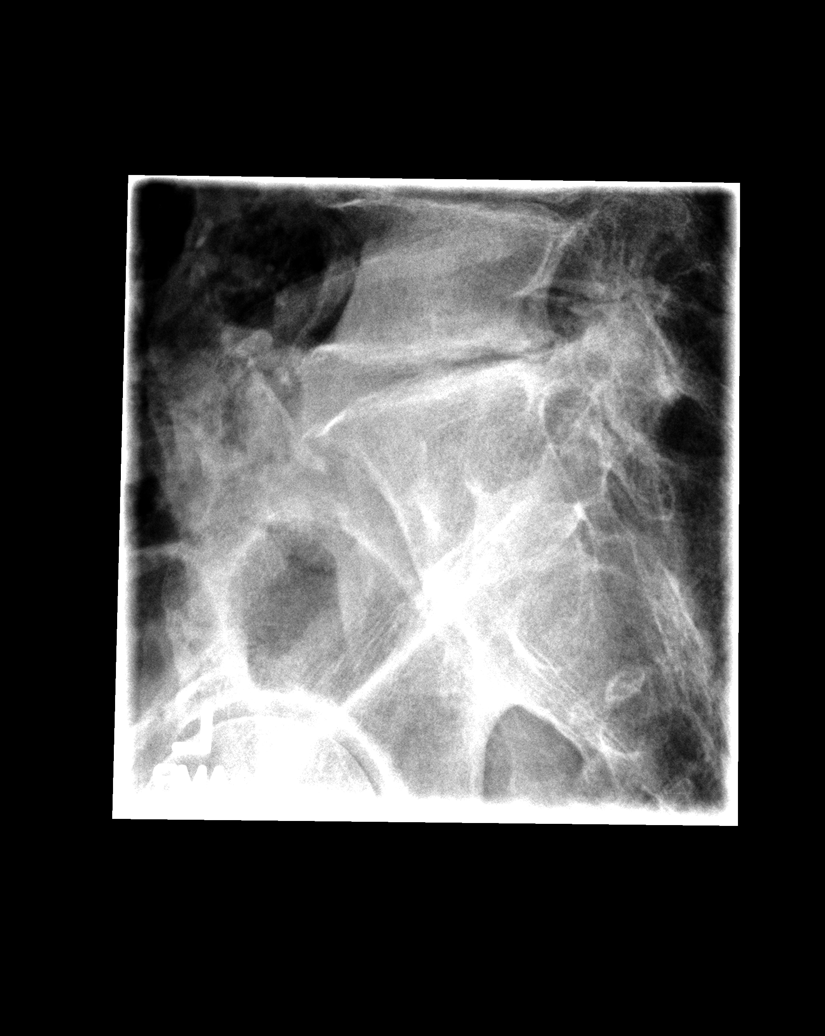

[5 of 5 positions shown; findings below may reference images not displayed]

FINDINGS: Mild compression fracture of L1.  There is patchy
sclerosis of the vertebral bodies suggesting this could be a
pathologic fracture.

Moderately severe compression fracture of L2.  There is sclerosis
of the vertebral body with the possibility of metastatic disease.

No other fracture.  Patchy sclerotic changes also present at L3 and
L4.  There is disc degeneration at L4-5 and L5-S1.
IMPRESSION: Fractures of L1 and L[DATE] be recent fractures.  These are
suspicious for pathologic fractures.  Further evaluation with MRI
without and with contrast suggested.
